# Patient Record
Sex: Female | Born: 1995 | Race: White | Hispanic: No | Marital: Married | Smoking: Never smoker
Health system: Southern US, Community
[De-identification: ages and names within clinical notes are randomized; demographics above are authoritative.]

---

## 2020-04-04 ENCOUNTER — Encounter (HOSPITAL_BASED_OUTPATIENT_CLINIC_OR_DEPARTMENT_OTHER): Payer: Self-pay | Admitting: *Deleted

## 2020-04-04 ENCOUNTER — Emergency Department (HOSPITAL_BASED_OUTPATIENT_CLINIC_OR_DEPARTMENT_OTHER): Payer: Managed Care, Other (non HMO)

## 2020-04-04 ENCOUNTER — Emergency Department (HOSPITAL_BASED_OUTPATIENT_CLINIC_OR_DEPARTMENT_OTHER)
Admission: EM | Admit: 2020-04-04 | Discharge: 2020-04-04 | Disposition: A | Discharge status: Home / Self Care | Payer: Managed Care, Other (non HMO) | Attending: Emergency Medicine | Admitting: Emergency Medicine

## 2020-04-04 ENCOUNTER — Other Ambulatory Visit: Payer: Self-pay

## 2020-04-04 DIAGNOSIS — R1012 Left upper quadrant pain: Secondary | ICD-10-CM | POA: Insufficient documentation

## 2020-04-04 DIAGNOSIS — R1011 Right upper quadrant pain: Secondary | ICD-10-CM

## 2020-04-04 LAB — URINALYSIS, ROUTINE W REFLEX MICROSCOPIC
Bilirubin Urine: NEGATIVE
Glucose, UA: NEGATIVE mg/dL
Ketones, ur: 15 mg/dL — AB
Nitrite: NEGATIVE
Protein, ur: NEGATIVE mg/dL
Specific Gravity, Urine: >1.03 — ABNORMAL HIGH (ref 1.005–1.030)
pH: 5.5 (ref 5.0–8.0)

## 2020-04-04 LAB — CBC WITH DIFFERENTIAL/PLATELET
Abs Immature Granulocytes: 0.02 10*3/uL (ref 0.00–0.07)
Basophils Absolute: 0 10*3/uL (ref 0.0–0.1)
Basophils Relative: 0 %
Eosinophils Absolute: 0 10*3/uL (ref 0.0–0.5)
Eosinophils Relative: 0 %
HCT: 37.6 % (ref 36.0–46.0)
Hemoglobin: 13 g/dL (ref 12.0–15.0)
Immature Granulocytes: 0 %
Lymphocytes Relative: 12 %
Lymphs Abs: 0.8 10*3/uL (ref 0.7–4.0)
MCH: 33.6 pg (ref 26.0–34.0)
MCHC: 34.6 g/dL (ref 30.0–36.0)
MCV: 97.2 fL (ref 80.0–100.0)
Monocytes Absolute: 0.5 10*3/uL (ref 0.1–1.0)
Monocytes Relative: 8 %
Neutro Abs: 5 10*3/uL (ref 1.7–7.7)
Neutrophils Relative %: 80 %
Platelets: 175 10*3/uL (ref 150–400)
RBC: 3.87 MIL/uL (ref 3.87–5.11)
RDW: 11.6 % (ref 11.5–15.5)
WBC: 6.2 10*3/uL (ref 4.0–10.5)
nRBC: 0 % (ref 0.0–0.2)

## 2020-04-04 LAB — PREGNANCY, URINE: Preg Test, Ur: NEGATIVE

## 2020-04-04 LAB — COMPREHENSIVE METABOLIC PANEL
ALT: 14 U/L (ref 0–44)
AST: 18 U/L (ref 15–41)
Albumin: 4.4 g/dL (ref 3.5–5.0)
Alkaline Phosphatase: 32 U/L — ABNORMAL LOW (ref 38–126)
Anion gap: 8 (ref 5–15)
BUN: 11 mg/dL (ref 6–20)
CO2: 22 mmol/L (ref 22–32)
Calcium: 9.1 mg/dL (ref 8.9–10.3)
Chloride: 108 mmol/L (ref 98–111)
Creatinine, Ser: 0.67 mg/dL (ref 0.44–1.00)
GFR, Estimated: >60 mL/min (ref >60)
Glucose, Bld: 96 mg/dL (ref 70–99)
Potassium: 3.4 mmol/L — ABNORMAL LOW (ref 3.5–5.1)
Sodium: 138 mmol/L (ref 135–145)
Total Bilirubin: 0.7 mg/dL (ref 0.3–1.2)
Total Protein: 7.3 g/dL (ref 6.5–8.1)

## 2020-04-04 LAB — URINALYSIS, MICROSCOPIC (REFLEX): RBC / HPF: >50 RBC/hpf (ref 0–5)

## 2020-04-04 LAB — HEPATITIS PANEL, ACUTE
HCV Ab: NONREACTIVE
Hep A IgM: NONREACTIVE
Hep B C IgM: NONREACTIVE
Hepatitis B Surface Ag: NONREACTIVE

## 2020-04-04 LAB — LIPASE, BLOOD: Lipase: 41 U/L (ref 11–51)

## 2020-04-04 NOTE — ED Triage Notes (Signed)
Right upper quadrant pain off and on for 2 years. Pain has been worse for the past several weeks. She was seen by her MD today and told to come here for further testing.

## 2020-04-04 NOTE — Discharge Instructions (Addendum)
Follow-up with your primary care doctor.  Continue Tylenol and Motrin as needed for pain.

## 2020-04-04 NOTE — ED Provider Notes (Signed)
Patient signed out to me at 3 PM awaiting CT scan.  Having chronic right upper quadrant pain.  CT scan was normal.  No gallstones.  Normal appendix.  No kidney stones.  Given reassurance and discharged in ED in good condition.  Lab work otherwise unremarkable.  Suspect chronic musculoskeletal type pain.  Possibly some GI related pain.  Recommend follow-up with primary care doctor.  This chart was dictated using voice recognition software.  Despite best efforts to proofread,  errors can occur which can change the documentation meaning.     Virgina Norfolk, DO 04/04/20 1636

## 2020-04-05 LAB — URINE CULTURE: Culture: 70000 — AB

## 2020-04-06 ENCOUNTER — Telehealth: Payer: Self-pay | Admitting: *Deleted

## 2020-04-06 NOTE — Telephone Encounter (Signed)
Post ED Visit - Positive Culture Follow-up  Culture report reviewed by antimicrobial stewardship pharmacist: [facility] []  Nathan Batchelder, Pharm.D. []  , Pharm.D., BCPS AQ-ID []  , Pharm.D., BCPS []  Celedonio Miyamoto, Pharm.D., BCPS []  [place], Garvin Fila.D., BCPS, AAHIVP []  , Pharm.D., BCPS, AAHIVP []  Georgina Pillion, PharmD, BCPS []  , PharmD, BCPS []  [place], PharmD, BCPS []  [address], PharmD []  , PharmD, BCPS []  Estella Husk, PharmD  Pharmacy Team []  Lysle Pearl, PharmD []  , PharmD []  Phillips Climes, PharmD []  , Rph []  Agapito Games) , PharmD []  Verlan Friends, PharmD []  , PharmD []  Mervyn Gay, PharmD []  , PharmD []  Vinnie Level, PharmD []  [facility], PharmD []  , PharmD []  Len Childs, PharmD   Positive urine culture No UTI symptoms, WBCs WNL and no further patient follow-up is required at this time.  [facility] 04/06/2020, 10:24 AM

## 2020-04-14 NOTE — ED Provider Notes (Signed)
[facility] EMERGENCY DEPARTMENT Provider Note   CSN: [id number] Arrival date & time: 04/04/20  1208     History Chief Complaint  Patient presents with  . Abdominal Pain    Deborah Berry is a 24 y.o. female.  Patient is a 24 year old female with no significant past medical history.  She presents with complaints of left upper quadrant pain radiating to the left flank.  Patient states that she has been having this discomfort on and off for the past 2 years.  It has become worse over the past 2 weeks and was instructed by her doctor to come to the ER for further evaluation.  She denies any bowel or bladder complaints.  She denies any fevers or chills.  There are no aggravating or alleviating factors and the pain seems to occur at random.  The history is provided by the patient.       History reviewed. No pertinent past medical history.  There are no problems to display for this patient.   History reviewed. No pertinent surgical history.   OB History   No obstetric history on file.     No family history on file.  Social History   Tobacco Use  . Smoking status: Never Smoker  . Smokeless tobacco: Never Used  Substance Use Topics  . Alcohol use: Never  . Drug use: Never    Home Medications Prior to Admission medications   Not on File    Allergies    Penicillins  Review of Systems   Review of Systems  All other systems reviewed and are negative.   Physical Exam Updated Vital Signs BP 123/70 (BP Location: Left Arm)   Pulse 85   Temp 98.3 F (36.8 C) (Oral)   Resp 16   Ht 5' 6.5" (1.689 m)   Wt 51.3 kg   LMP 04/04/2020   SpO2 100%   BMI 17.97 kg/m   Physical Exam Vitals and nursing note reviewed.  Constitutional:      General: She is not in acute distress.    Appearance: She is well-developed and well-nourished. She is not diaphoretic.  HENT:     Head: Normocephalic and atraumatic.  Cardiovascular:     Rate and Rhythm: Normal rate  and regular rhythm.     Heart sounds: No murmur heard. No friction rub. No gallop.   Pulmonary:     Effort: Pulmonary effort is normal. No respiratory distress.     Breath sounds: Normal breath sounds. No wheezing.  Abdominal:     General: Bowel sounds are normal. There is no distension.     Palpations: Abdomen is soft.     Tenderness: There is abdominal tenderness in the left upper quadrant. There is left CVA tenderness. There is no right CVA tenderness, guarding or rebound.  Musculoskeletal:        General: Normal range of motion.     Cervical back: Normal range of motion and neck supple.  Skin:    General: Skin is warm and dry.  Neurological:     Mental Status: She is alert and oriented to person, place, and time.     ED Results / Procedures / Treatments   Labs (all labs ordered are listed, but only abnormal results are displayed) Labs Reviewed  URINE CULTURE - Abnormal; Notable for the following components:      Result Value   Culture   (*)    Value: 70,000 COLONIES/mL GROUP B STREP(S.AGALACTIAE)ISOLATED TESTING AGAINST S. AGALACTIAE NOT ROUTINELY  PERFORMED DUE TO PREDICTABILITY OF AMP/PEN/VAN SUSCEPTIBILITY. Performed at [facility], [address] [place], [place] [address]    All other components within normal limits  COMPREHENSIVE METABOLIC PANEL - Abnormal; Notable for the following components:   Potassium 3.4 (*)    Alkaline Phosphatase 32 (*)    All other components within normal limits  URINALYSIS, ROUTINE W REFLEX MICROSCOPIC - Abnormal; Notable for the following components:   APPearance CLOUDY (*)    Specific Gravity, Urine >1.030 (*)    Hgb urine dipstick LARGE (*)    Ketones, ur 15 (*)    Leukocytes,Ua TRACE (*)    All other components within normal limits  URINALYSIS, MICROSCOPIC (REFLEX) - Abnormal; Notable for the following components:   Bacteria, UA MANY (*)    All other components within normal limits  LIPASE, BLOOD  CBC WITH  DIFFERENTIAL/PLATELET  PREGNANCY, URINE  HEPATITIS PANEL, ACUTE    EKG None  Radiology No results found.  Procedures Procedures (including critical care time)  Medications Ordered in ED Medications - No data to display  ED Course  I have reviewed the triage vital signs and the nursing notes.  Pertinent labs & imaging results that were available during my care of the patient were reviewed by me and considered in my medical decision making (see chart for details).    MDM Rules/Calculators/A&P  Patient's laboratory studies thus far unremarkable.  She has no white count and urine shows only blood, but no convincing evidence of infection.  I had extensive discussion with the patient regarding imaging studies.  He has ultimately agreed to have a CT scan.  This study will be obtained.  Care will be signed out to the oncoming provider at shift change who will obtain the results of the study and determine the final disposition.  Final Clinical Impression(s) / ED Diagnoses Final diagnoses:  RUQ pain    Rx / DC Orders ED Discharge Orders    None       Geoffery Lyons, MD 04/14/20 2327

## 2020-10-14 ENCOUNTER — Other Ambulatory Visit: Payer: Self-pay

## 2020-10-14 ENCOUNTER — Emergency Department (HOSPITAL_BASED_OUTPATIENT_CLINIC_OR_DEPARTMENT_OTHER): Payer: Managed Care, Other (non HMO)

## 2020-10-14 ENCOUNTER — Encounter (HOSPITAL_BASED_OUTPATIENT_CLINIC_OR_DEPARTMENT_OTHER): Payer: Self-pay | Admitting: Emergency Medicine

## 2020-10-14 ENCOUNTER — Emergency Department (HOSPITAL_BASED_OUTPATIENT_CLINIC_OR_DEPARTMENT_OTHER)
Admission: EM | Admit: 2020-10-14 | Discharge: 2020-10-14 | Disposition: A | Discharge status: Home / Self Care | Payer: Managed Care, Other (non HMO) | Attending: Emergency Medicine | Admitting: Emergency Medicine

## 2020-10-14 DIAGNOSIS — R102 Pelvic and perineal pain: Secondary | ICD-10-CM | POA: Diagnosis present

## 2020-10-14 DIAGNOSIS — R1031 Right lower quadrant pain: Secondary | ICD-10-CM | POA: Diagnosis not present

## 2020-10-14 LAB — COMPREHENSIVE METABOLIC PANEL
ALT: 14 U/L (ref 0–44)
AST: 17 U/L (ref 15–41)
Albumin: 4.7 g/dL (ref 3.5–5.0)
Alkaline Phosphatase: 36 U/L — ABNORMAL LOW (ref 38–126)
Anion gap: 8 (ref 5–15)
BUN: 15 mg/dL (ref 6–20)
CO2: 26 mmol/L (ref 22–32)
Calcium: 9.4 mg/dL (ref 8.9–10.3)
Chloride: 106 mmol/L (ref 98–111)
Creatinine, Ser: 0.8 mg/dL (ref 0.44–1.00)
GFR, Estimated: >60 mL/min (ref >60)
Glucose, Bld: 101 mg/dL — ABNORMAL HIGH (ref 70–99)
Potassium: 3.6 mmol/L (ref 3.5–5.1)
Sodium: 140 mmol/L (ref 135–145)
Total Bilirubin: 0.8 mg/dL (ref 0.3–1.2)
Total Protein: 7.7 g/dL (ref 6.5–8.1)

## 2020-10-14 LAB — URINALYSIS, ROUTINE W REFLEX MICROSCOPIC
Bilirubin Urine: NEGATIVE
Glucose, UA: NEGATIVE mg/dL
Ketones, ur: 15 mg/dL — AB
Leukocytes,Ua: NEGATIVE
Nitrite: NEGATIVE
Protein, ur: NEGATIVE mg/dL
Specific Gravity, Urine: >1.03 — ABNORMAL HIGH (ref 1.005–1.030)
pH: 6 (ref 5.0–8.0)

## 2020-10-14 LAB — CBC
HCT: 38.6 % (ref 36.0–46.0)
Hemoglobin: 12.9 g/dL (ref 12.0–15.0)
MCH: 33.1 pg (ref 26.0–34.0)
MCHC: 33.4 g/dL (ref 30.0–36.0)
MCV: 99 fL (ref 80.0–100.0)
Platelets: 234 10*3/uL (ref 150–400)
RBC: 3.9 MIL/uL (ref 3.87–5.11)
RDW: 11.9 % (ref 11.5–15.5)
WBC: 4.9 10*3/uL (ref 4.0–10.5)
nRBC: 0 % (ref 0.0–0.2)

## 2020-10-14 LAB — LIPASE, BLOOD: Lipase: 52 U/L — ABNORMAL HIGH (ref 11–51)

## 2020-10-14 LAB — URINALYSIS, MICROSCOPIC (REFLEX)

## 2020-10-14 LAB — PREGNANCY, URINE: Preg Test, Ur: NEGATIVE

## 2020-10-14 MED ORDER — IBUPROFEN 800 MG PO TABS: 800.0000 mg | ORAL_TABLET | Freq: Three times a day (TID) | ORAL | 0 refills | Status: DC

## 2020-10-14 NOTE — ED Notes (Signed)
ED Provider at bedside. 

## 2020-10-14 NOTE — Discharge Instructions (Addendum)
1.  You may take ibuprofen 800 mg every 8 hours for pain. 2.  If the pain is worsening, concentrating more in the right lower abdomen or if you develop fever or worsening symptoms, return to emergency department for recheck. 3.  If the pain is not getting any worse or improving, schedule a recheck with your gynecologist as soon as possible.

## 2020-10-14 NOTE — ED Provider Notes (Signed)
[facility] EMERGENCY DEPARTMENT Provider Note   CSN: [id number] Arrival date & time: 10/14/20  1748     History Chief Complaint  Patient presents with   Abdominal Pain    Deborah Berry is a 25 y.o. female with no relevant past medical history presents the ED with a 1 day history of right lower quadrant abdominal pain.  On my examination, patient reports that for the past day she has been experiencing right sided pelvic pain that radiates to her right lower back.  She states that the symptoms are waxing and waning.  She has had nausea, but no emesis.  She denies any history of prior.  She does state that she has a history of dysmenorrhea that is often right-sided and she is currently on her menses.  However, she states this feels different which prompted her to come to the ED for evaluation.  She states that she does not want any CT imaging even though she is concerned about appendicitis.  She states that she first wanted to at least have a physical exam and laboratory work-up performed.  She denies any fevers or chills, emesis, history of ovarian cysts, dyspareunia, vaginal discharge, concern for STDs, or any other symptoms.  She is in a committed relationship with her husband and has a 27-year-old at home.  She is not constipated, but states that her stools have been mildly firmer than usual.  Most recent bowel movement was this morning.  Denies any urinary symptoms.  She does have a family history of kidney stones.  HPI     History reviewed. No pertinent past medical history.  There are no problems to display for this patient.   History reviewed. No pertinent surgical history.   OB History   No obstetric history on file.     No family history on file.  Social History   Tobacco Use   Smoking status: Never   Smokeless tobacco: Never  Substance Use Topics   Alcohol use: Never   Drug use: Never    Home Medications Prior to Admission medications   Not  on File    Allergies    Penicillins  Review of Systems   Review of Systems  All other systems reviewed and are negative.  Physical Exam Updated Vital Signs BP 107/63 (BP Location: Right Arm)   Pulse 65   Temp 98.6 F (37 C) (Oral)   Resp 16   Ht 5\' 6"  (1.676 m)   Wt 51.3 kg   LMP 10/09/2020   SpO2 100%   BMI 18.24 kg/m   Physical Exam Vitals and nursing note reviewed. Exam conducted with a chaperone present.  Constitutional:      General: She is not in acute distress.    Appearance: Normal appearance. She is not toxic-appearing.  HENT:     Head: Normocephalic and atraumatic.  Eyes:     General: No scleral icterus.    Conjunctiva/sclera: Conjunctivae normal.  Cardiovascular:     Rate and Rhythm: Normal rate.     Pulses: Normal pulses.  Pulmonary:     Effort: Pulmonary effort is normal. No respiratory distress.  Abdominal:     General: Abdomen is flat. There is no distension.     Palpations: Abdomen is soft. There is no mass.     Tenderness: There is no abdominal tenderness. There is no guarding.     Hernia: No hernia is present.     Comments: Soft, nondistended.  There is no abdominal tenderness  to palpation.  No overlying skin changes.  Musculoskeletal:     Cervical back: Normal range of motion.  Skin:    General: Skin is dry.  Neurological:     Mental Status: She is alert and oriented to person, place, and time.     GCS: GCS eye subscore is 4. GCS verbal subscore is 5. GCS motor subscore is 6.  Psychiatric:        Mood and Affect: Mood normal.        Behavior: Behavior normal.        Thought Content: Thought content normal.    ED Results / Procedures / Treatments   Labs (all labs ordered are listed, but only abnormal results are displayed) Labs Reviewed  LIPASE, BLOOD - Abnormal; Notable for the following components:      Result Value   Lipase 52 (*)    All other components within normal limits  COMPREHENSIVE METABOLIC PANEL - Abnormal; Notable for  the following components:   Glucose, Bld 101 (*)    Alkaline Phosphatase 36 (*)    All other components within normal limits  URINALYSIS, ROUTINE W REFLEX MICROSCOPIC - Abnormal; Notable for the following components:   Specific Gravity, Urine >1.030 (*)    Hgb urine dipstick TRACE (*)    Ketones, ur 15 (*)    All other components within normal limits  URINALYSIS, MICROSCOPIC (REFLEX) - Abnormal; Notable for the following components:   Bacteria, UA MANY (*)    All other components within normal limits  CBC  PREGNANCY, URINE    EKG None  Radiology No results found.  Procedures Procedures   Medications Ordered in ED Medications - No data to display  ED Course  I have reviewed the triage vital signs and the nursing notes.  Pertinent labs & imaging results that were available during my care of the patient were reviewed by me and considered in my medical decision making (see chart for details).    MDM Rules/Calculators/A&P                          Deborah Berry was evaluated in Emergency Department on 10/14/2020 for the symptoms described in the history of present illness. She was evaluated in the context of the global COVID-19 pandemic, which necessitated consideration that the patient might be at risk for infection with the SARS-CoV-2 virus that causes COVID-19. Institutional protocols and algorithms that pertain to the evaluation of patients at risk for COVID-19 are in a state of rapid change based on information released by regulatory bodies including the CDC and federal and state organizations. These policies and algorithms were followed during the patient's care in the ED.  I personally reviewed patient's medical chart and all notes from triage and staff during today's encounter. I have also ordered and reviewed all labs and imaging that I felt to be medically necessary in the evaluation of this patient's complaints and with consideration of their physical exam. If needed,  translation services were available and utilized.   Patient's pain symptoms seem to be pelvic in etiology.  We will proceed with ultrasound before they leave here for the evening.  Suspect ovarian cyst, but would like to rule out ovarian torsion.  If negative, likely her known dysmenorrhea.  Do not feel as though CT is warranted given lack of McBurney's point tenderness or other significant abdominal tenderness on my exam.  Again, suspect this is pelvic.  She has been afebrile  and her laboratory work-up is entirely reassuring.  No leukocytosis concerning for systemic illness.  Her vital signs are stable and within normal limits.  Additionally, after discussion with patient, she would prefer continued observation rather than CT imaging.  At shift change care was transferred to Dr. Clarice Pole who will follow pending studies, re-evaluate, and determine disposition.    If ultrasound is negative, patient is reasonable for discharge home.  She does not want CT imaging and her abdominal exam is entirely benign.  Laboratory work-up is reassuring.  She is afebrile and vital signs stable.  If there is no large ovarian cyst or ovarian torsion, suspect that her pain symptoms are related to her known history of right-sided dysmenorrhea.  Final Clinical Impression(s) / ED Diagnoses Final diagnoses:  Pelvic pain    Rx / DC Orders ED Discharge Orders     None        Elvera Maria 10/14/20 2121    Arby Barrette, MD 10/14/20 2249

## 2020-10-14 NOTE — ED Triage Notes (Signed)
Reports RLQ abdominal pain since yesterday.  Does endorse mild constipation and nausea.  Denies any vomiting or diarrhea.

## 2022-04-04 IMAGING — CT CT RENAL STONE PROTOCOL
2 of 4 series · 16 of 46 positions shown, 18 images · non-contrast
Comparison: Ultrasound 03/04/2020

CLINICAL DATA: Flank pain, stone disease suspected, intermittent
right upper quadrant pain for 2 years. Worsening over the past
several weeks.

EXAM:
CT ABDOMEN AND PELVIS WITHOUT CONTRAST
TECHNIQUE: Multidetector CT imaging of the abdomen and pelvis was performed
following the standard protocol without IV contrast.

[Series 2: axial st · axial · 0.72mm/px · z∈[-471,-81]mm · 13 of 86 slices shown, 15 images]
[im 4/86  soft-tissue]
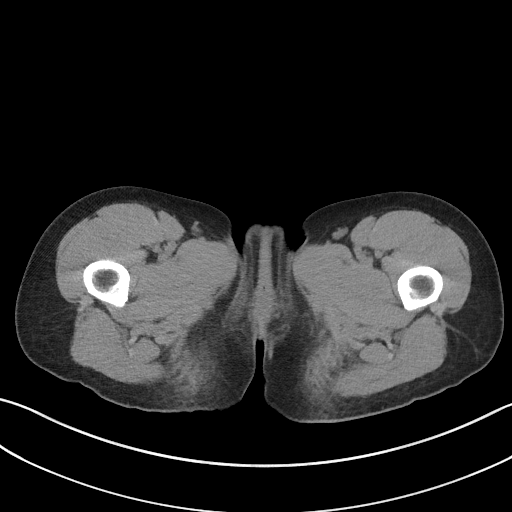
[im 4/86  bone]
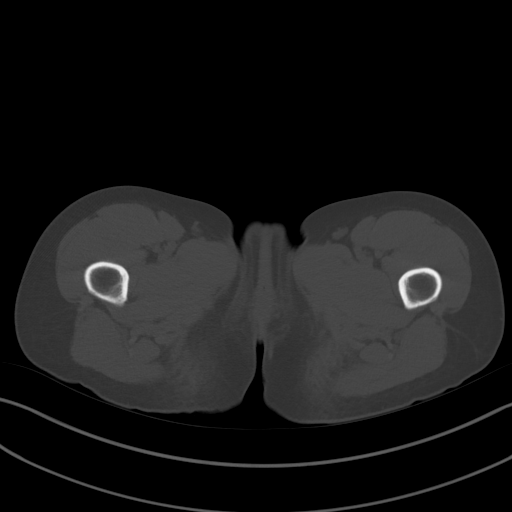
[im 11/86  soft-tissue]
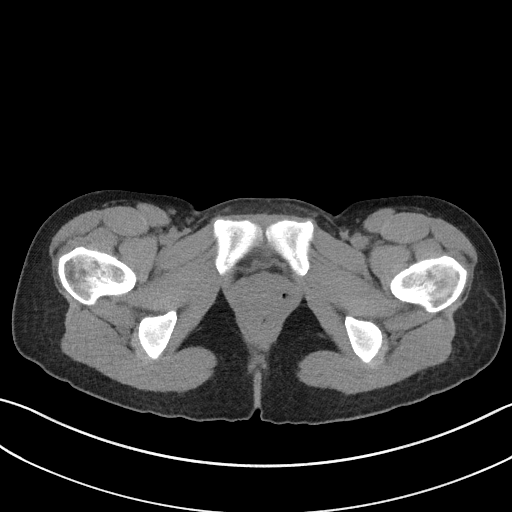
[im 18/86  soft-tissue]
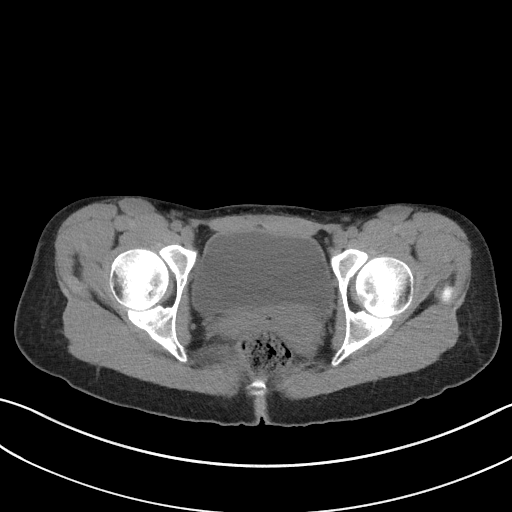
[im 24/86  soft-tissue]
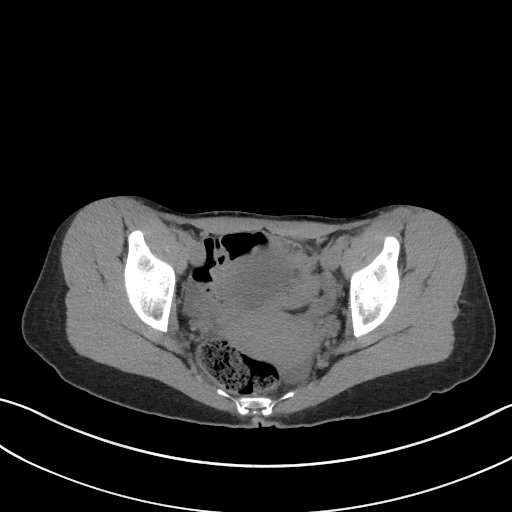
[im 31/86  soft-tissue]
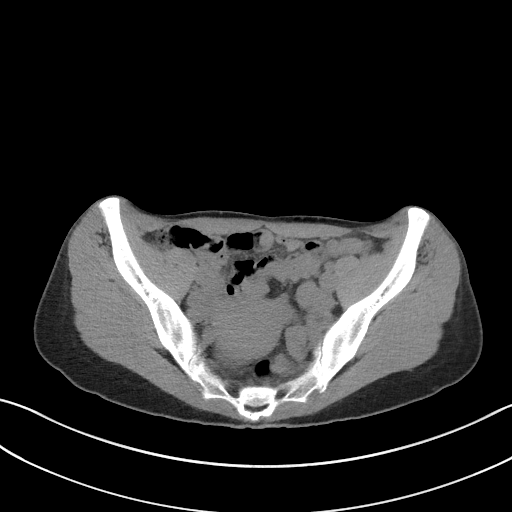
[im 38/86  soft-tissue]
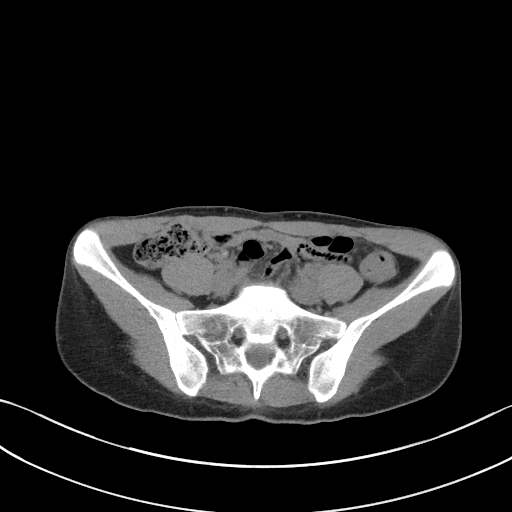
[im 45/86  soft-tissue]
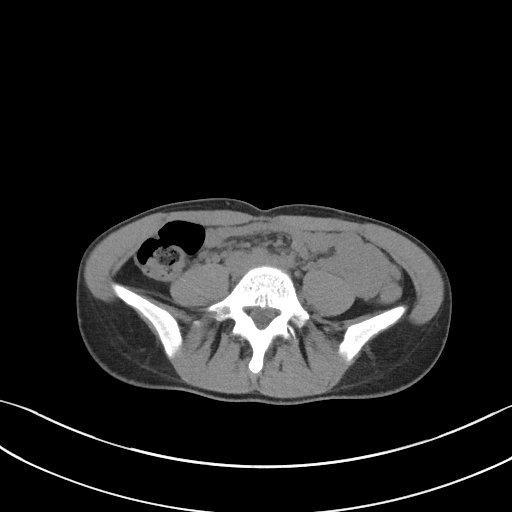
[im 48/86  soft-tissue]
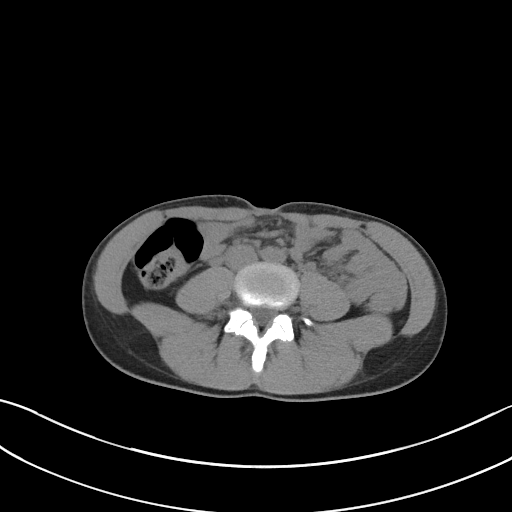
[im 55/86  soft-tissue]
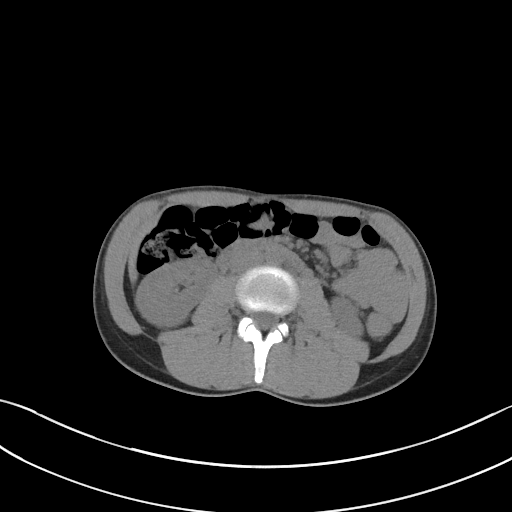
[im 55/86  bone]
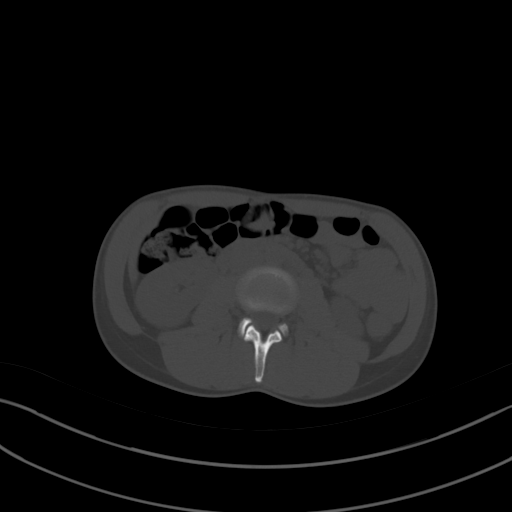
[im 62/86  soft-tissue]
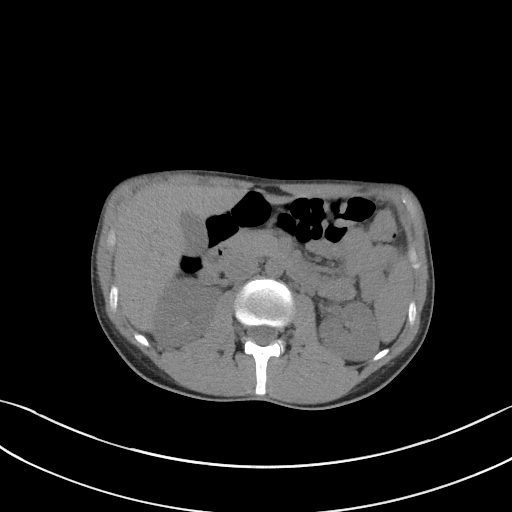
[im 69/86  soft-tissue]
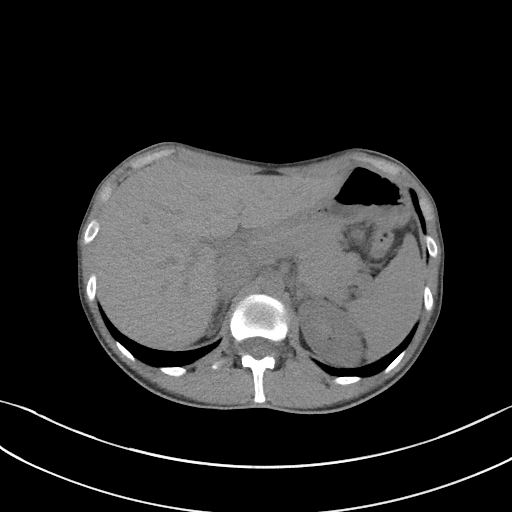
[im 75/86  soft-tissue]
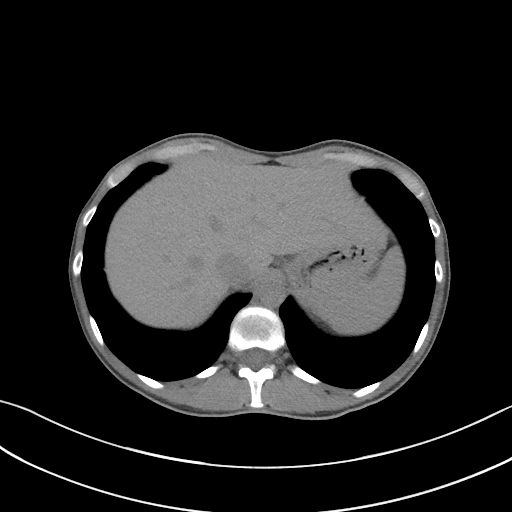
[im 82/86  soft-tissue]
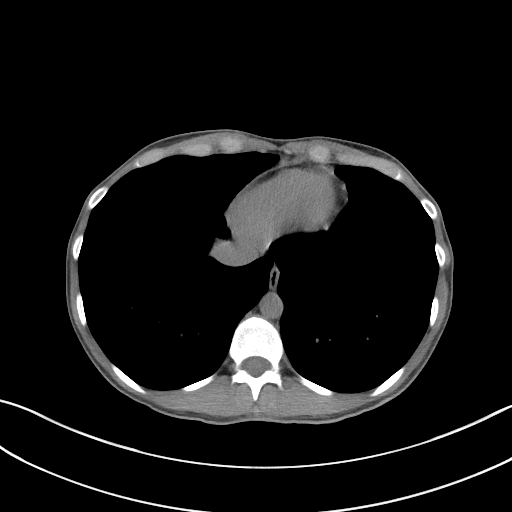

[Series 5: coronal st · coronal · 0.66mm/px · 3 of 70 slices shown]
[im 24/70  soft-tissue]
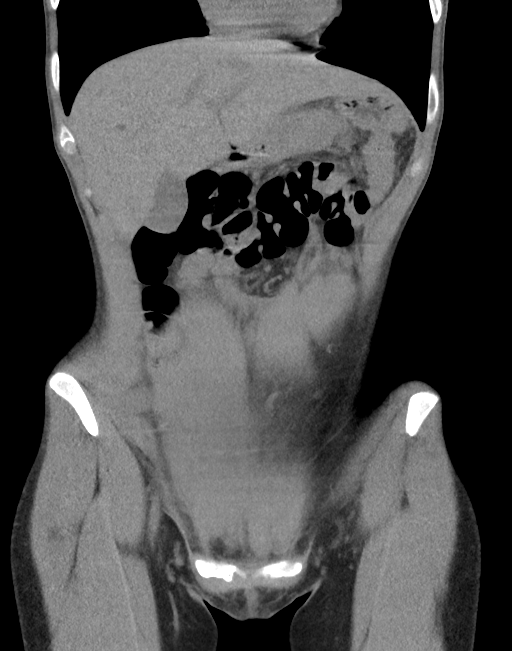
[im 31/70  soft-tissue]
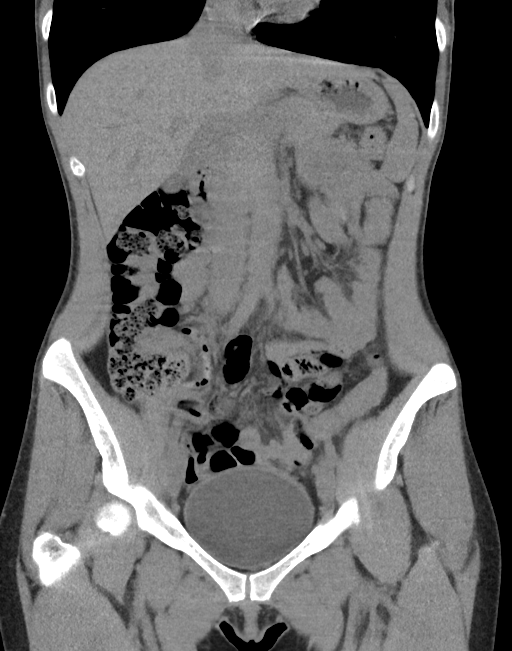
[im 39/70  soft-tissue]
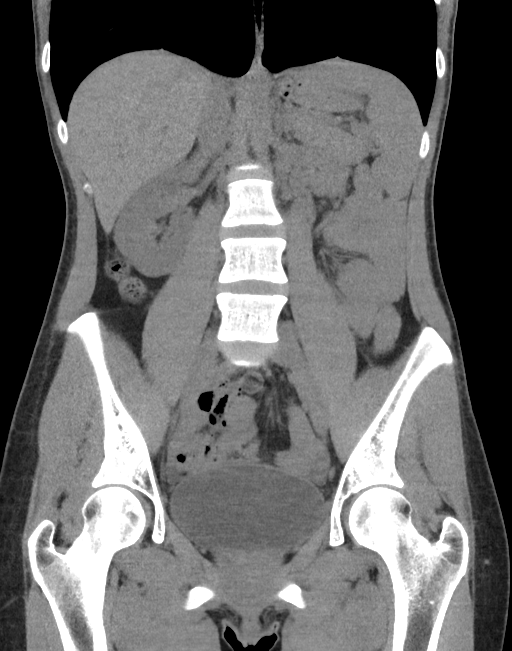

[16 of 46 positions shown; findings below may reference images not displayed]

FINDINGS: Lower chest: Lung bases are clear. Normal heart size. No pericardial
effusion.

Hepatobiliary: Few subcentimeter hypoattenuating foci in the liver
too small to fully characterize on CT imaging but statistically
likely benign ([DATE], 21, 19). Normal liver attenuation. Smooth liver
surface contour. Normal gallbladder and biliary tree.

Pancreas: No pancreatic ductal dilatation or surrounding
inflammatory changes.

Spleen: Normal in size. No concerning splenic lesions.

Adrenals/Urinary Tract: Normal adrenal glands. Kidneys are symmetric
in size and normally located. No visible or contour deforming renal
lesions. No visible urolithiasis or hydronephrosis. Urinary bladder
is unremarkable for degree of distension.

Stomach/Bowel: Challenging assessment of the bowel and mesentery
given a paucity of intraperitoneal fat. Distal esophagus, stomach
and duodenal sweep are unremarkable. No small bowel wall thickening
or dilatation. No evidence of obstruction. A normal appendix is
visualized.

Vascular/Lymphatic: No significant vascular findings are present. No
enlarged abdominal or pelvic lymph nodes.

Reproductive: Slightly retroverted uterus. No concerning adnexal
lesions.

Other: Small volume free fluid in the deep pelvis is nonspecific in
a reproductive age female, often times physiologic. No bowel
containing hernias.

Musculoskeletal: No acute osseous abnormality or suspicious osseous
lesion.
IMPRESSION: 1. No visible urolithiasis or hydronephrosis.
2. Small volume free fluid in the deep pelvis is nonspecific in a
reproductive age female, often physiologic.

## 2022-04-04 IMAGING — US US ABDOMEN LIMITED
1 series · 14 of 25 positions shown · non-contrast
Comparison: None.

CLINICAL DATA: Right upper quadrant pain

EXAM:
ULTRASOUND ABDOMEN LIMITED RIGHT UPPER QUADRANT

[Series 1: us abdomen limited · 14 of 32 slices shown]
[im 1/32]
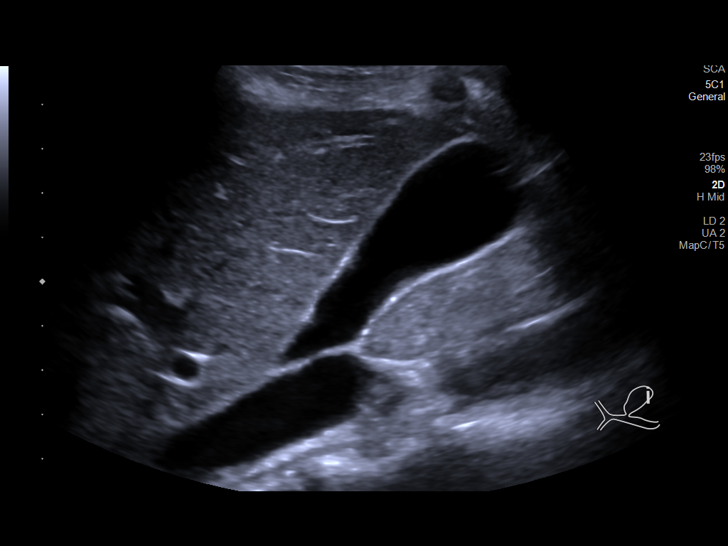
[im 3/32]
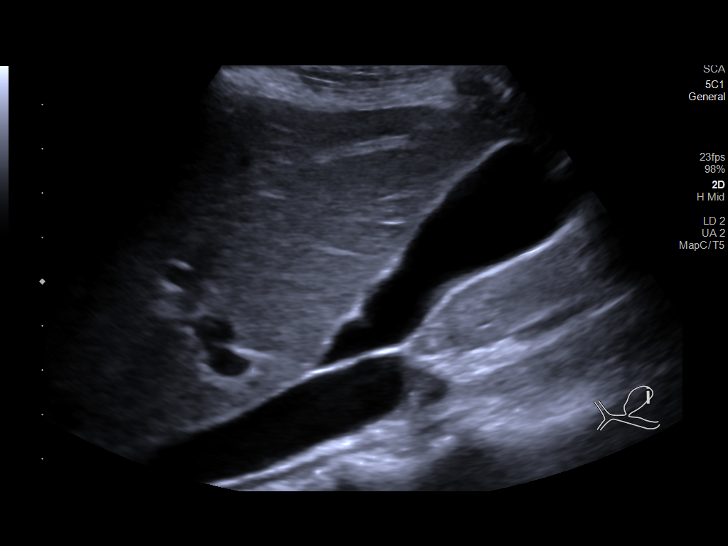
[im 6/32]
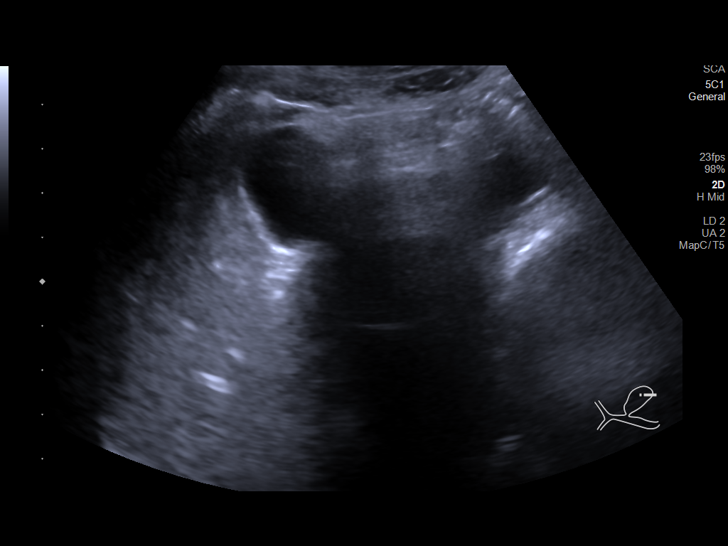
[im 8/32]
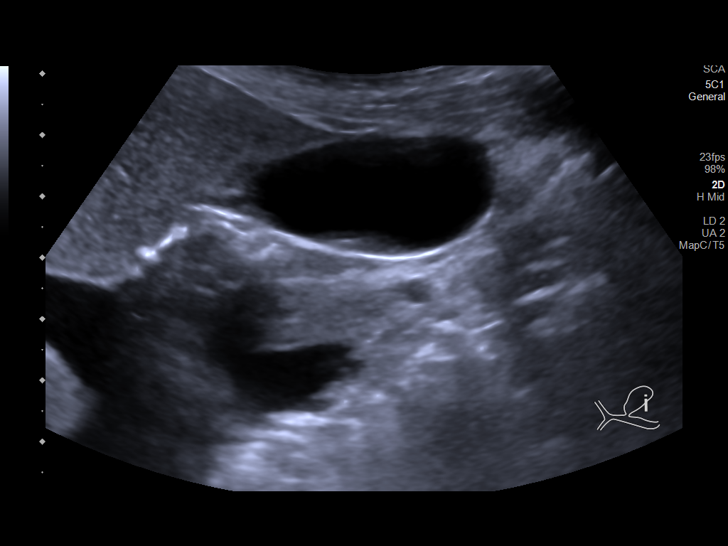
[im 11/32]
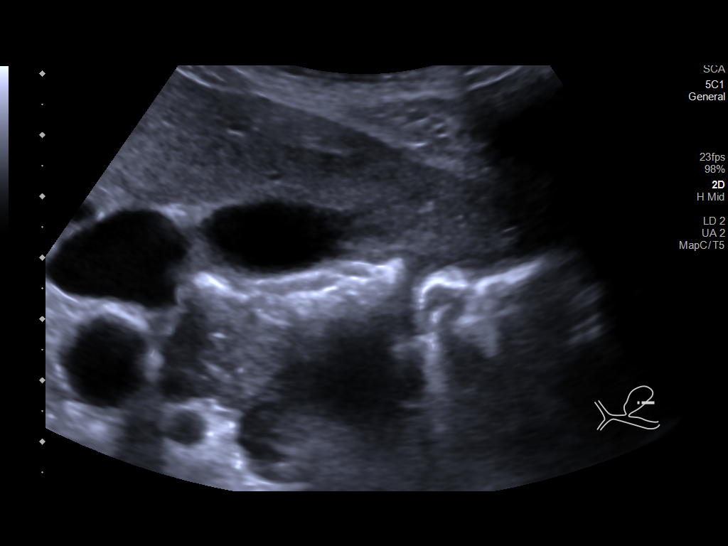
[im 12/32]
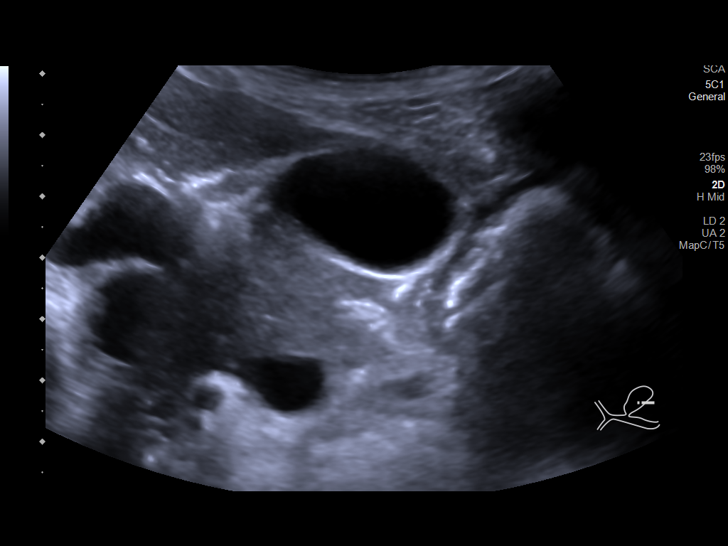
[im 15/32]
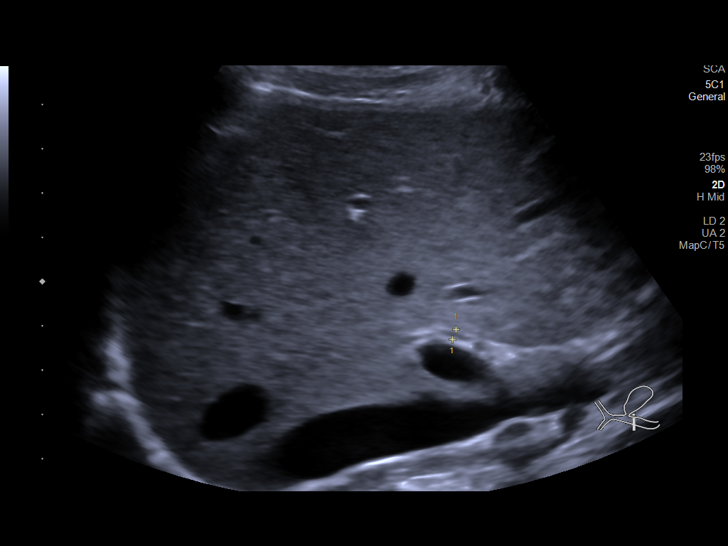
[im 17/32]
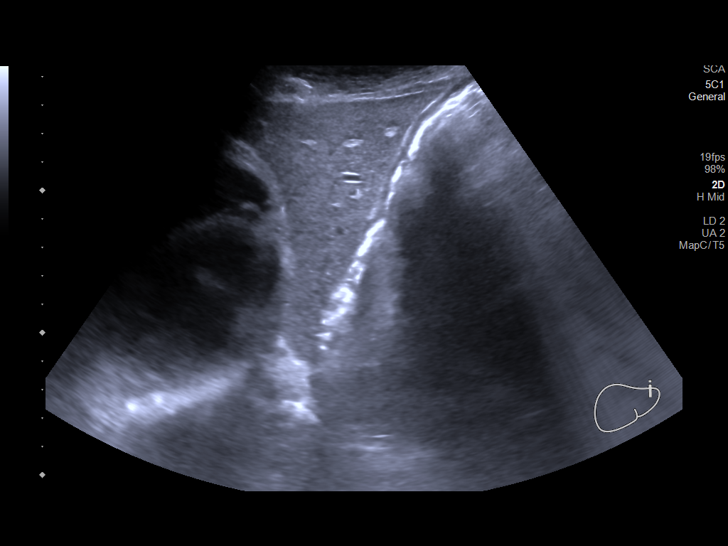
[im 20/32]
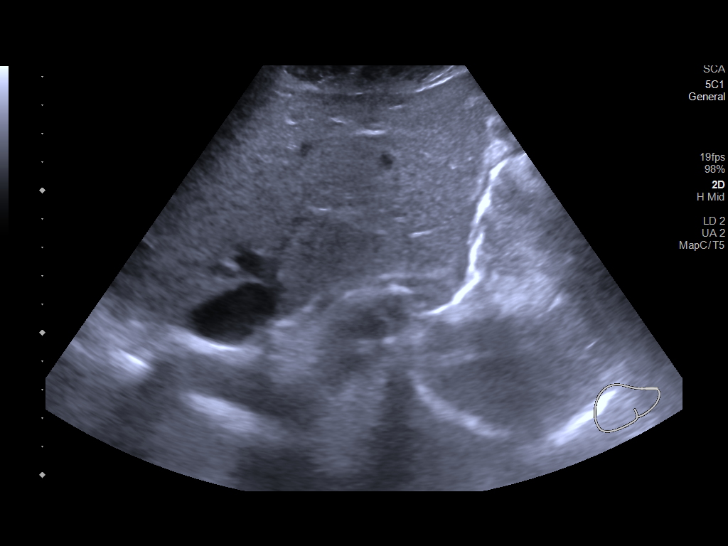
[im 21/32]
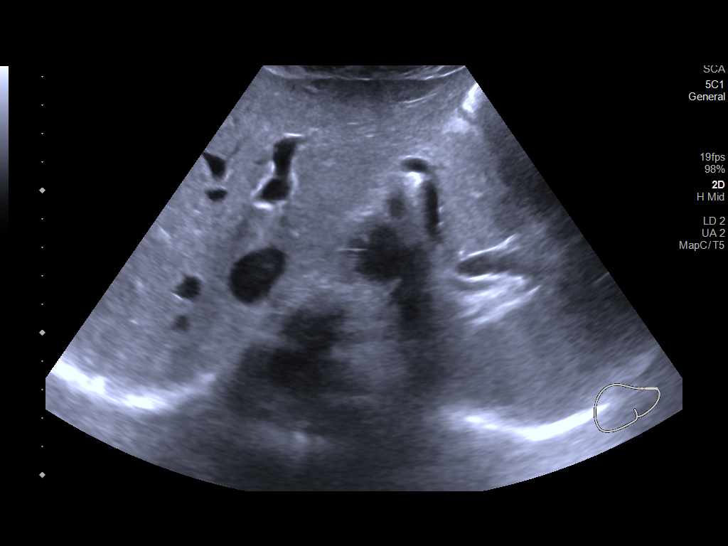
[im 24/32]
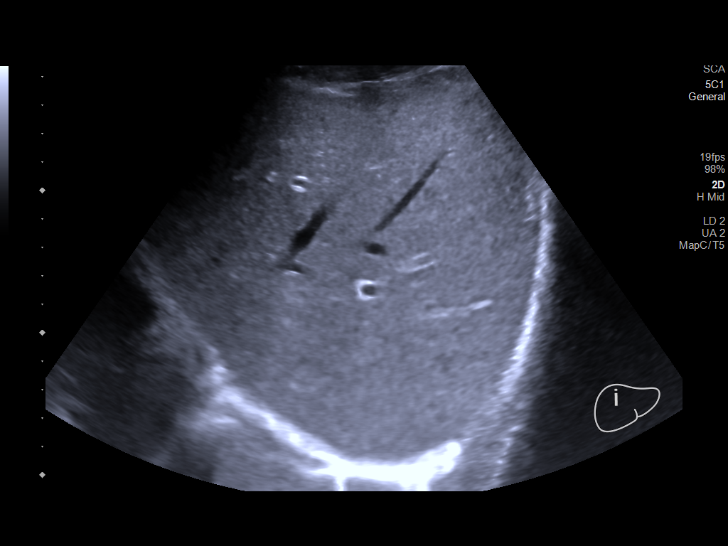
[im 26/32]
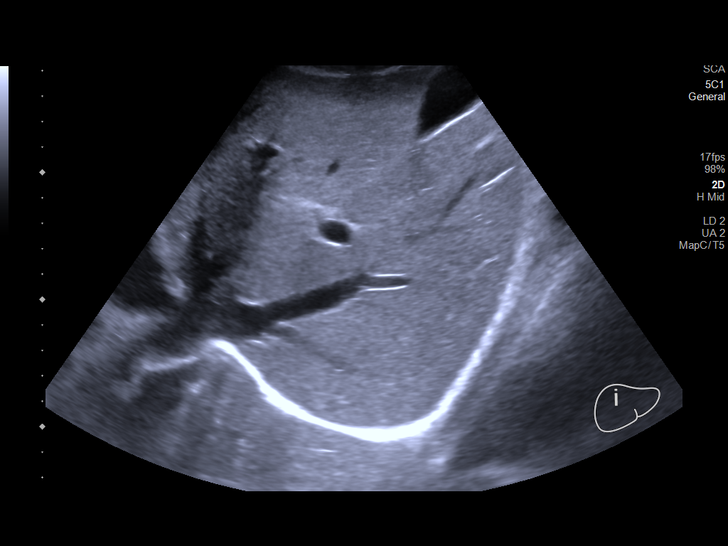
[im 29/32]
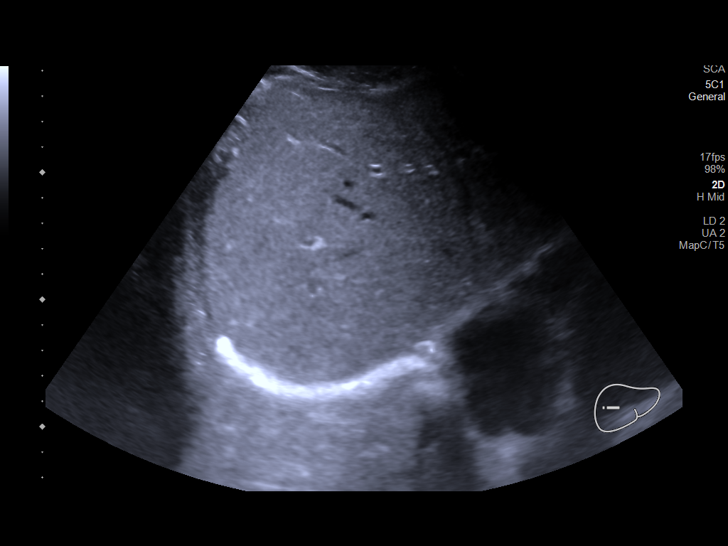
[im 32/32]
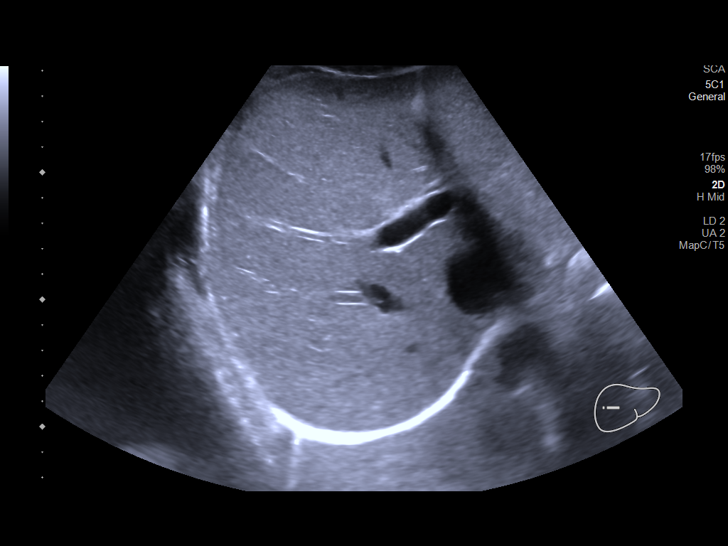

[14 of 25 positions shown; findings below may reference images not displayed]

FINDINGS: Gallbladder:

No gallstones or wall thickening visualized. No sonographic Murphy
sign noted by sonographer.

Common bile duct:

Diameter: 2 mm, normal

Liver:

No focal lesion identified. Within normal limits in parenchymal
echogenicity. Portal vein is patent on color Doppler imaging with
normal direction of blood flow towards the liver.

Other: None.
IMPRESSION: Normal right upper quadrant ultrasound.

## 2022-10-14 IMAGING — US US PELVIS COMPLETE TRANSABD/TRANSVAG W DUPLEX
1 series · 13 of 25 positions shown · non-contrast
Comparison: Prior CT from 04/04/2020.

CLINICAL DATA: Initial evaluation for acute right lower
quadrant/right flank pain with nausea.

EXAM:
TRANSABDOMINAL AND TRANSVAGINAL ULTRASOUND OF PELVIS
DOPPLER ULTRASOUND OF OVARIES
TECHNIQUE: Both transabdominal and transvaginal ultrasound examinations of the
pelvis were performed. Transabdominal technique was performed for
global imaging of the pelvis including uterus, ovaries, adnexal
regions, and pelvic cul-de-sac.
It was necessary to proceed with endovaginal exam following the
transabdominal exam to visualize the uterus, endometrium, and
ovaries. Color and duplex Doppler ultrasound was utilized to
evaluate blood flow to the ovaries.

[Series 1: us pelvis complete transabd/transvag w duplex · 13 of 74 slices shown]
[im 1/74]
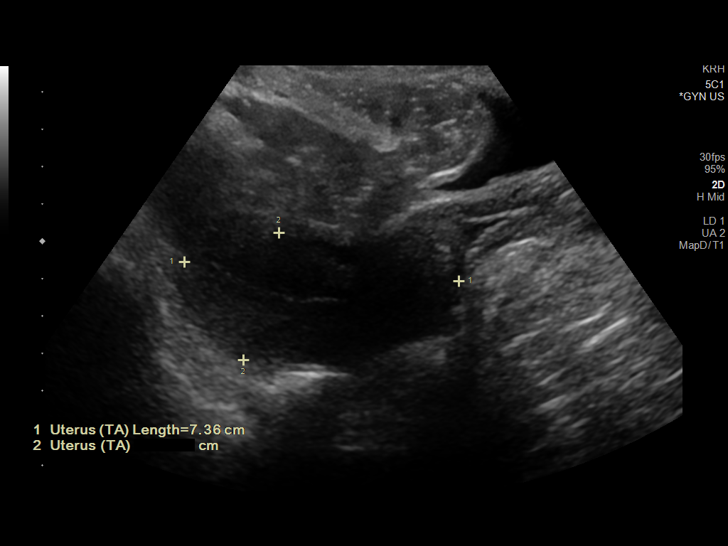
[im 7/74]
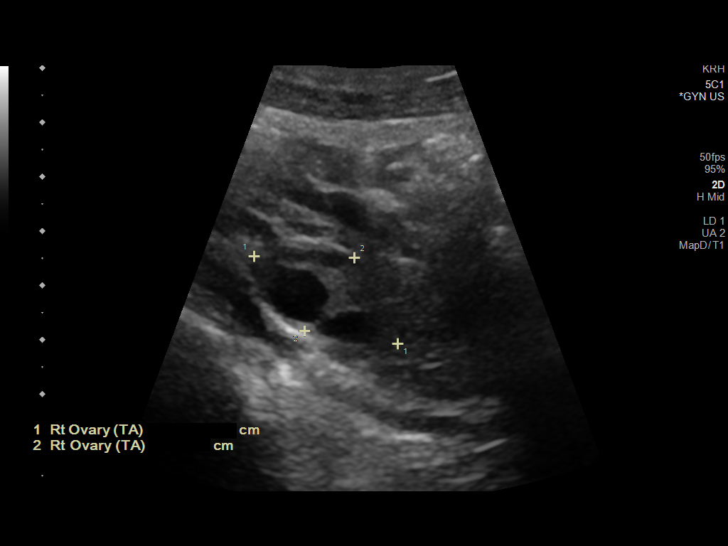
[im 13/74]
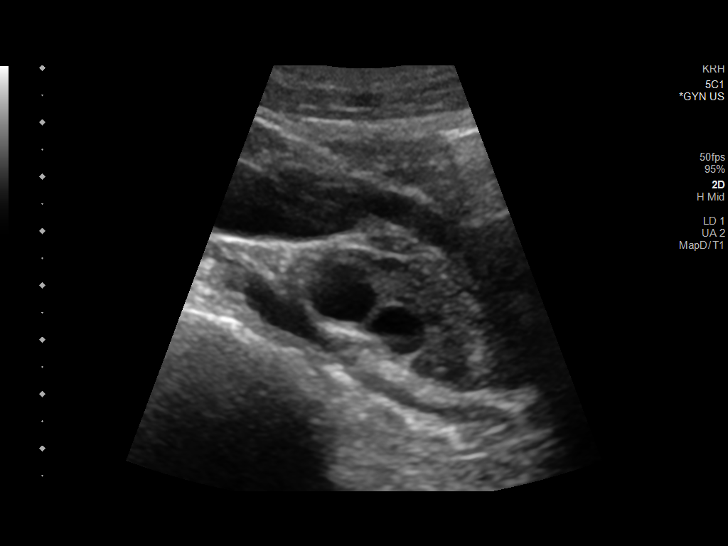
[im 19/74]
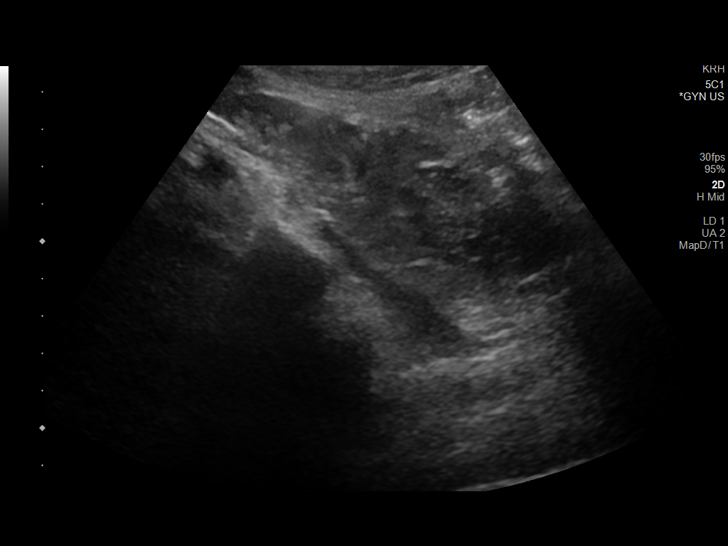
[im 25/74]
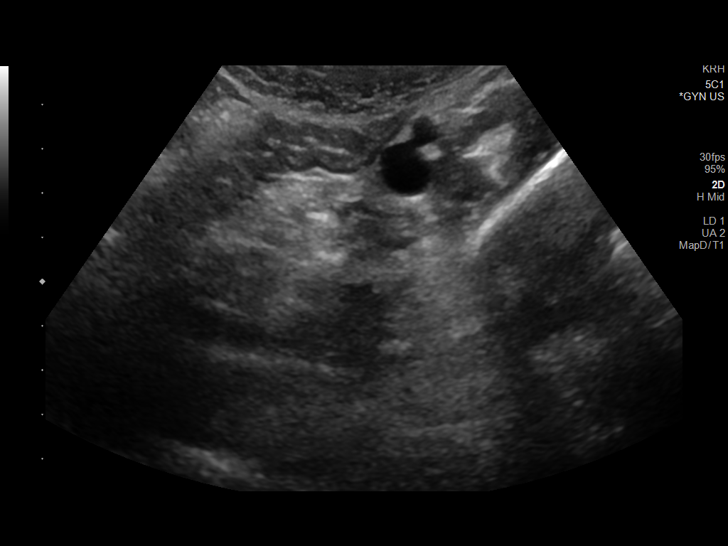
[im 31/74]
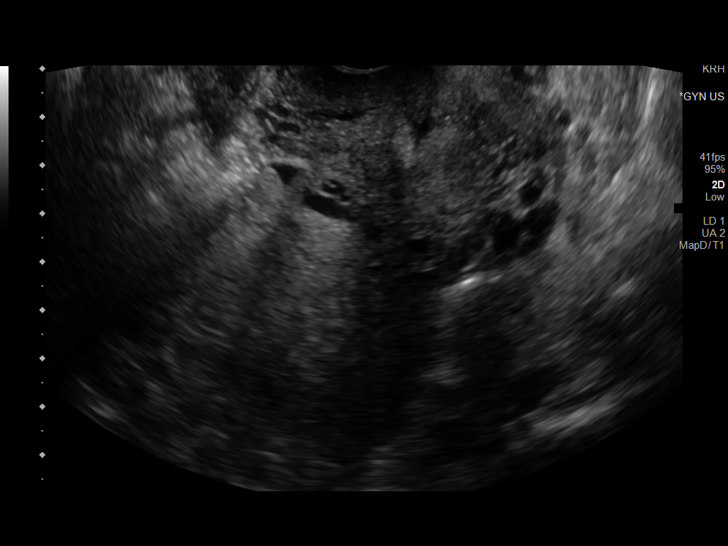
[im 37/74]
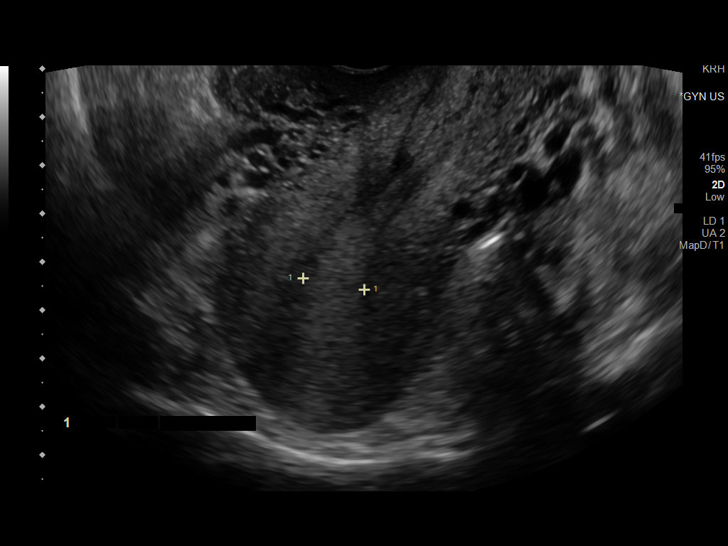
[im 43/74]
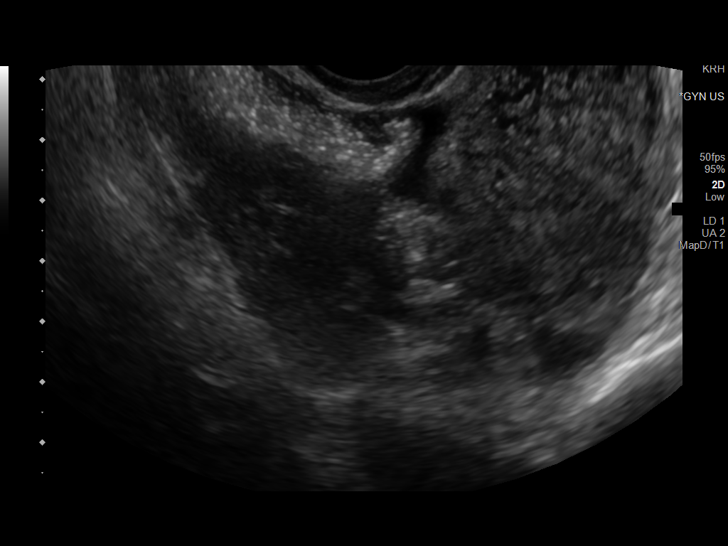
[im 49/74]
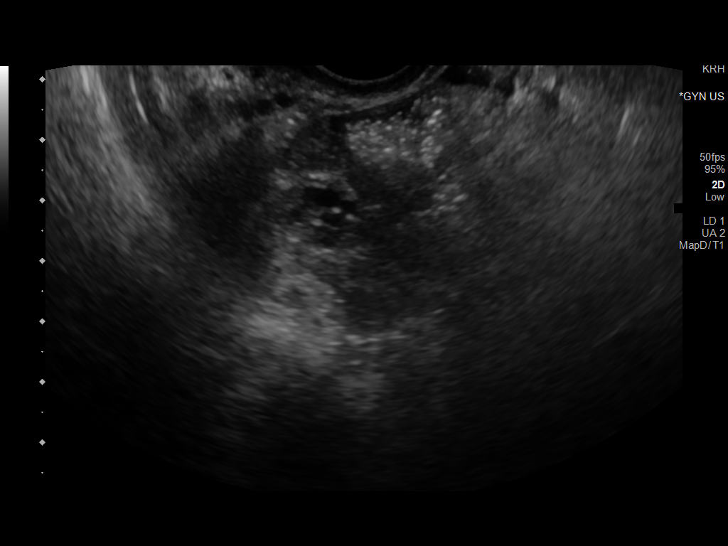
[im 55/74]
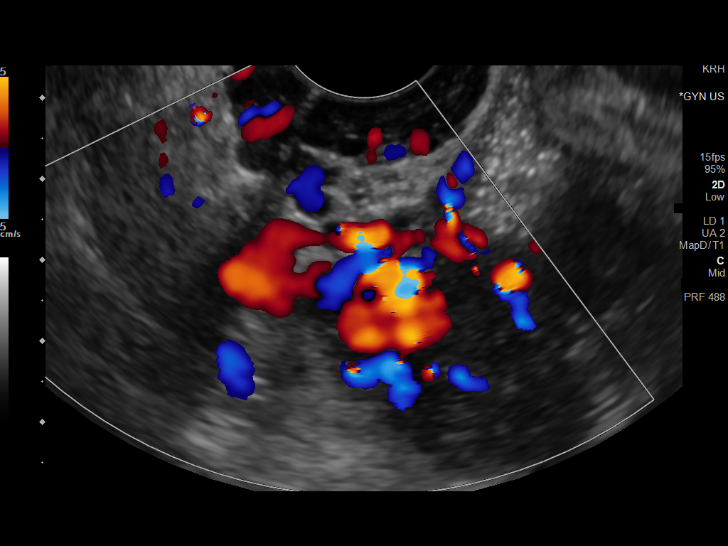
[im 61/74]
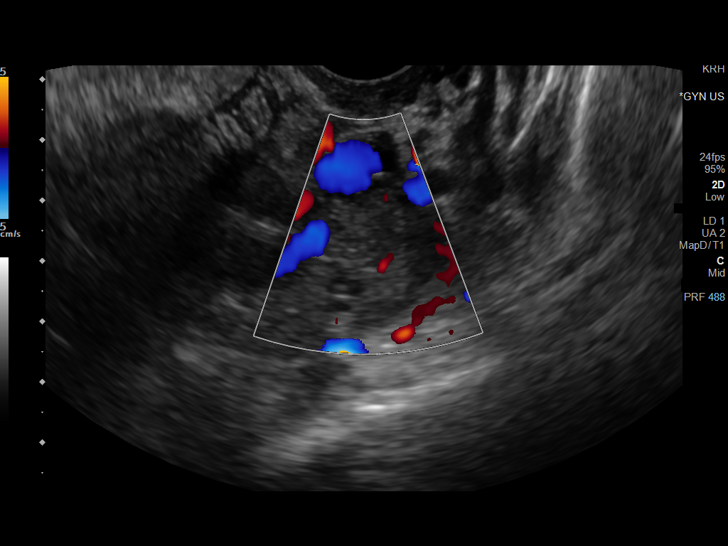
[im 67/74]
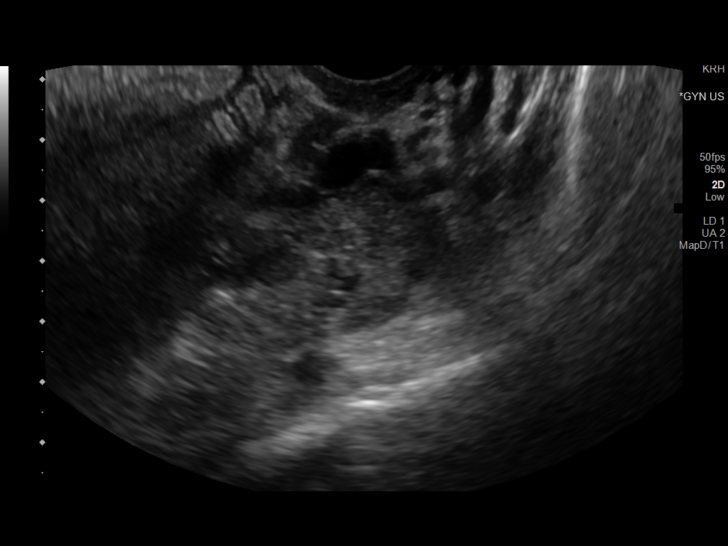
[im 74/74]
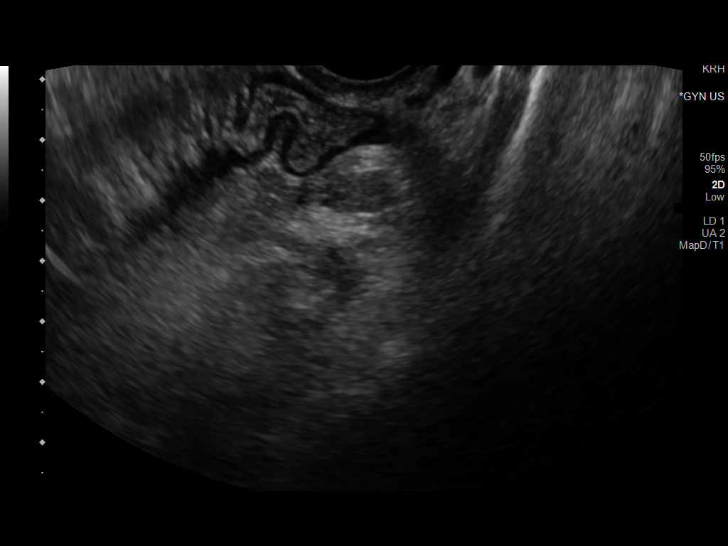

[13 of 25 positions shown; findings below may reference images not displayed]

FINDINGS: Uterus

Measurements: 9.5 x 4.9 x 5.0 cm = volume: 121.2 mL. Uterus is
retroflexed. No discrete fibroid or other mass.

Endometrium

Thickness: 13 mm.  No focal abnormality visualized.

Right ovary

Measurements: 3.2 x 1.7 x 2.1 cm = volume: 6.1 mL. Normal
appearance/no adnexal mass.

Left ovary

Measurements: 3.2 x 2.3 x 2.1 cm = volume: 8.1 mL. Normal
appearance/no adnexal mass.

Pulsed Doppler evaluation of both ovaries demonstrates normal
low-resistance arterial and venous waveforms.

Other findings

Trace free physiologic fluid present within the pelvis.

Mild prominence of the pelvic vasculature seen within the left
greater than right adnexa.
IMPRESSION: 1. Mild prominence of the pelvic vasculature within the left greater
than right adnexa, nonspecific, but can be seen in the setting of
pelvic congestion syndrome.
2. Otherwise unremarkable and normal pelvic ultrasound. No evidence
for torsion or other acute abnormality.

## 2022-12-22 LAB — CMP 10231: EGFR: 105

## 2023-02-05 ENCOUNTER — Ambulatory Visit: Payer: Managed Care, Other (non HMO) | Admitting: Internal Medicine

## 2023-02-25 NOTE — Progress Notes (Unsigned)
NEW PATIENT Date of Service/Encounter:  02/26/23 Referring provider: none-self referred Primary care provider: Patient, No Pcp Per  Subjective:  Deborah Berry is a 27 y.o. female presenting today for evaluation of recurrent infections, concern for immune deficiency. History obtained from: chart review and patient.   Discussed the use of AI scribe software for clinical note transcription with the patient, who gave verbal consent to proceed.  History of Present Illness   The patient, with a history of chronic health issues since the birth of her child five years ago, sought care from a provider in [place] due to lack of satisfactory answers from local providers. She has been experiencing chronic symptoms, including a low white blood cell count and high immunoglobulin M levels, first identified in April. The patient prefers a natural approach to treatment and followed a protocol involving supplements and lifestyle changes. However, upon retesting in August, the immunoglobulin M levels were found to be higher and the white blood cell count lower. The patient also contracted Fifths disease over the summer, which she believes may have affected the lab results.  The patient has a history of multiple infections since the birth of her child, including a uterine infection postpartum, mastitis twice within the first four weeks of her child's life, and suspected UTIs. She received multiple courses of antibiotics for these infections. However, outside of these instances, she has not had any other significant infections requiring antibiotics.  The patient also has a history of allergies, including seasonal allergies in spring and fall, which typically manifest as headaches. She has undergone allergy testing and shots in the past.  She has a known allergy to penicillin and amoxicillin, which causes a rash, and a food allergy to casein, which causes gastrointestinal upset. She avoids dairy in her diet.   She also avoids gluten.  The patient has a history of eczema, which was primarily triggered by chlorine exposure from swimming.  This is no longer a concern or issue.  She has received all her childhood vaccines and does not take any medications currently. She has noticed that when she does get sick, she tends to be ill for longer than others with the same illness.      Chart Review: reviewed on patient's phone LABS WBC: 3.0 (12/2022) Neutrophils: 1.5 (12/2022) Lymphocytes: 1.1 (12/2022) Monocytes: 0.3 (12/2022) Eosinophils: 0.1 (12/2022) IgM: 351 (12/2022) IgG: 1112 (12/2022) IgA: 168 (12/2022) IgE: 10 (12/2022)  Other allergy screening: Asthma: no History of recurrent infections suggestive of immunodeficency: no Vaccinations are up to date.  No COVID or flu vaccines, but has childhood vaccines  Past Medical History: No past medical history on file. Medication List:  Current Outpatient Medications  Medication Sig Dispense Refill   ibuprofen (ADVIL) 800 MG tablet Take 1 tablet (800 mg total) by mouth 3 (three) times daily. (Patient not taking: Reported on 02/26/2023) 21 tablet 0   No current facility-administered medications for this visit.   Known Allergies:  Allergies  Allergen Reactions   Penicillins Rash   Past Surgical History: No past surgical history on file. Family History: No family history on file. Social History: [place] lives in a house built around 20 years ago, no water damage, wood floors, gas heat, central AC, 1 dog, no roaches, using dust mite covers on the bed and the pillows, no smoke exposure.  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  + HEPA filter in the home.  [place]   ROS:  All other systems negative except as noted  per HPI.  Objective:  Blood pressure 110/62, pulse 81, temperature 99.3 F (37.4 C), temperature source Temporal, resp. rate 20, height 5' 6.5" (1.689 m), weight 118 lb 4.8 oz (53.7 kg), SpO2 99%. Body mass index is 18.81  kg/m. Physical Exam:  General Appearance:  Alert, cooperative, no distress, appears stated age  Head:  Normocephalic, without obvious abnormality, atraumatic  Eyes:  Conjunctiva clear, EOM's intact  Ears EACs normal bilaterally and normal TMs bilaterally  Nose: Nares normal, hypertrophic turbinates  Throat: Lips, tongue normal; teeth and gums normal, normal posterior oropharynx  Neck: Supple, symmetrical  Lungs:   clear to auscultation bilaterally, Respirations unlabored, no coughing  Heart:  regular rate and rhythm and no murmur, Appears well perfused  Extremities: No edema  Skin: Skin color, texture, turgor normal and no rashes or lesions on visualized portions of skin  Neurologic: No gross deficits   Diagnostics:  Labs:  Lab Orders         CBC with Differential/Platelet         Complement, total         Diphtheria / Tetanus Antibody Panel         IgG, IgA, IgM         Lymph Enumeration, Basic & NK Cells         Strep pneumoniae 23 Serotypes IgG       Assessment and Plan  Assessment and Plan    Chronic Low White Blood Cell Count Chronic low white blood cell count since 2017, with no recurrent infections or hospitalizations. Recent labs in August showed a further decrease, possibly related to a recent illness (Fifth disease). -Order complete blood count (CBC) to reassess white blood cell count. -Order lymph enumeration to evaluate B/T cells -Consider referral to an oncologist if white blood cell count remains low.  Elevated Immunoglobulin M (IgM) Only slightly elevated without infections concerning for PID.   Elevated IgM noted in April and August labs, with no associated symptoms or recurrent infections. -Repeat immunoglobulin panel to reassess IgM levels. -Check titers for diphtheria and tetanus to assess immune response to vaccines as well as strep pneumonia.  Dairy Intolerance History of casein allergy with gastrointestinal symptoms upon ingestion. Currently  avoiding dairy and gluten products. -No change in management.  H/O Penicillin allergy: - please schedule follow-up appt at your convenience for penicillin testing followed by graded oral challenge if indicated - please refrain from taking any antihistamines at least 3 days prior to this appointment  - around 80% of individuals outgrow this allergy in ~ 10 years and carrying it as a diagnosis can prevent you from getting proper therapy if needed  General Health Maintenance -Consider Pneumovax 23 vaccine depending on results of strep pneumonia titers.     Follow up : pending results It was a pleasure meeting you in clinic today! Thank you for allowing me to participate in your care.  Tonny Bollman, MD [facility]  This note in its entirety was forwarded to the Provider who requested this consultation.  Other: none  Thank you for your kind referral. I appreciate the opportunity to take part in Lanissa's care. Please do not hesitate to contact me with questions.  Sincerely,  Tonny Bollman, MD [facility]

## 2023-02-26 ENCOUNTER — Encounter: Payer: Self-pay | Admitting: Internal Medicine

## 2023-02-26 ENCOUNTER — Telehealth: Payer: Self-pay

## 2023-02-26 ENCOUNTER — Other Ambulatory Visit: Payer: Self-pay

## 2023-02-26 ENCOUNTER — Ambulatory Visit (INDEPENDENT_AMBULATORY_CARE_PROVIDER_SITE_OTHER): Payer: Managed Care, Other (non HMO) | Admitting: Internal Medicine

## 2023-02-26 VITALS — BP 110/62 | HR 81 | Temp 99.3°F | Resp 20 | Ht 66.5 in | Wt 118.3 lb

## 2023-02-26 DIAGNOSIS — B999 Unspecified infectious disease: Secondary | ICD-10-CM

## 2023-02-26 DIAGNOSIS — D72810 Lymphocytopenia: Secondary | ICD-10-CM | POA: Diagnosis not present

## 2023-02-26 DIAGNOSIS — Z880 Allergy status to penicillin: Secondary | ICD-10-CM | POA: Diagnosis not present

## 2023-02-26 DIAGNOSIS — T781XXA Other adverse food reactions, not elsewhere classified, initial encounter: Secondary | ICD-10-CM

## 2023-02-26 DIAGNOSIS — T781XXD Other adverse food reactions, not elsewhere classified, subsequent encounter: Secondary | ICD-10-CM

## 2023-02-26 NOTE — Patient Instructions (Addendum)
Chronic Low White Blood Cell Count Chronic low white blood cell count since 2017, with no recurrent infections or hospitalizations. Recent labs in August showed a further decrease, possibly related to a recent illness (Fifth disease). -Order complete blood count (CBC) to reassess white blood cell count. -Order lymph enumeration to evaluate B/T cells -Consider referral to an oncologist if white blood cell count remains low.  Elevated Immunoglobulin M (IgM) Elevated IgM noted in April and August labs, with no associated symptoms or recurrent infections. -Repeat immunoglobulin panel to reassess IgM levels. -Check titers for diphtheria and tetanus to assess immune response to vaccines as well as strep pneumonia.  Dairy Intolerance History of casein allergy with gastrointestinal symptoms upon ingestion. Currently avoiding dairy and gluten products. -No change in management.  H/O Penicillin allergy: - please schedule follow-up appt at your convenience for penicillin testing followed by graded oral challenge if indicated - please refrain from taking any antihistamines at least 3 days prior to this appointment  - around 80% of individuals outgrow this allergy in ~ 10 years and carrying it as a diagnosis can prevent you from getting proper therapy if needed   General Health Maintenance -Consider Pneumovax 23 vaccine depending on results of strep pneumonia titers.     Follow up : pending results It was a pleasure meeting you in clinic today! Thank you for allowing me to participate in your care.

## 2023-02-26 NOTE — Telephone Encounter (Signed)
Faxed release of medical records [facility] [place] . Called and spoke to Char at [phone] verified pt is extablished there and provider ( she is on maternity leave) faxed release to [phone]

## 2023-03-02 LAB — LYMPH ENUMERATION, BASIC & NK CELLS
% CD 3 Pos. Lymph.: 86 % (ref 57.5–86.2)
% CD 4 Pos. Lymph.: 49.9 % (ref 30.8–58.5)
% NK (CD56/16): 5.5 % (ref 1.4–19.4)
Ab NK (CD56/16): 61 /uL (ref 24–406)
Absolute CD 3: 946 /uL (ref 622–2402)
Absolute CD 4 Helper: 549 /uL (ref 359–1519)
Basophils Absolute: 0 10*3/uL (ref 0.0–0.2)
Basos: 0 %
CD19 % B Cell: 7.5 % (ref 3.3–25.4)
CD19 Abs: 83 /uL (ref 12–645)
CD4/CD8 Ratio: 1.43 (ref 0.92–3.72)
CD8 % Suppressor T Cell: 34.9 % (ref 12.0–35.5)
CD8 T Cell Abs: 384 /uL (ref 109–897)
EOS (ABSOLUTE): 0 10*3/uL (ref 0.0–0.4)
Eos: 0 %
Hematocrit: 38.9 % (ref 34.0–46.6)
Hemoglobin: 12.5 g/dL (ref 11.1–15.9)
Immature Grans (Abs): 0 10*3/uL (ref 0.0–0.1)
Immature Granulocytes: 0 %
Lymphocytes Absolute: 1.1 10*3/uL (ref 0.7–3.1)
Lymphs: 16 %
MCH: 32.2 pg (ref 26.6–33.0)
MCHC: 32.1 g/dL (ref 31.5–35.7)
MCV: 100 fL — ABNORMAL HIGH (ref 79–97)
Monocytes Absolute: 0.6 10*3/uL (ref 0.1–0.9)
Monocytes: 8 %
Neutrophils Absolute: 5.4 10*3/uL (ref 1.4–7.0)
Neutrophils: 76 %
Platelets: 183 10*3/uL (ref 150–450)
RBC: 3.88 x10E6/uL (ref 3.77–5.28)
RDW: 11.2 % — ABNORMAL LOW (ref 11.7–15.4)
WBC: 7.2 10*3/uL (ref 3.4–10.8)

## 2023-03-02 LAB — STREP PNEUMONIAE 23 SEROTYPES IGG
Pneumo Ab Type 1*: 0.1 ug/mL — ABNORMAL LOW (ref >1.3)
Pneumo Ab Type 12 (12F)*: <0.1 ug/mL — ABNORMAL LOW (ref >1.3)
Pneumo Ab Type 14*: 0.6 ug/mL — ABNORMAL LOW (ref >1.3)
Pneumo Ab Type 17 (17F)*: 2 ug/mL (ref >1.3)
Pneumo Ab Type 19 (19F)*: 9.5 ug/mL (ref >1.3)
Pneumo Ab Type 2*: 0.8 ug/mL — ABNORMAL LOW (ref >1.3)
Pneumo Ab Type 20*: 2.8 ug/mL (ref >1.3)
Pneumo Ab Type 22 (22F)*: 1.7 ug/mL (ref >1.3)
Pneumo Ab Type 23 (23F)*: 1.6 ug/mL (ref >1.3)
Pneumo Ab Type 26 (6B)*: 0.7 ug/mL — ABNORMAL LOW (ref >1.3)
Pneumo Ab Type 3*: 0.3 ug/mL — ABNORMAL LOW (ref >1.3)
Pneumo Ab Type 34 (10A)*: 1.3 ug/mL — ABNORMAL LOW (ref >1.3)
Pneumo Ab Type 4*: 0.2 ug/mL — ABNORMAL LOW (ref >1.3)
Pneumo Ab Type 43 (11A)*: 2.1 ug/mL (ref >1.3)
Pneumo Ab Type 5*: 0.3 ug/mL — ABNORMAL LOW (ref >1.3)
Pneumo Ab Type 51 (7F)*: 1.5 ug/mL (ref >1.3)
Pneumo Ab Type 54 (15B)*: 4.5 ug/mL (ref >1.3)
Pneumo Ab Type 56 (18C)*: 0.6 ug/mL — ABNORMAL LOW (ref >1.3)
Pneumo Ab Type 57 (19A)*: 4.6 ug/mL (ref >1.3)
Pneumo Ab Type 68 (9V)*: 0.1 ug/mL — ABNORMAL LOW (ref >1.3)
Pneumo Ab Type 70 (33F)*: 1.1 ug/mL — ABNORMAL LOW (ref >1.3)
Pneumo Ab Type 8*: 0.6 ug/mL — ABNORMAL LOW (ref >1.3)
Pneumo Ab Type 9 (9N)*: 0.2 ug/mL — ABNORMAL LOW (ref >1.3)

## 2023-03-02 LAB — IGG, IGA, IGM
IgA/Immunoglobulin A, Serum: 180 mg/dL (ref 87–352)
IgG (Immunoglobin G), Serum: 1058 mg/dL (ref 586–1602)
IgM (Immunoglobulin M), Srm: 300 mg/dL — ABNORMAL HIGH (ref 26–217)

## 2023-03-02 LAB — DIPHTHERIA / TETANUS ANTIBODY PANEL
Diphtheria Ab: 0.52 [IU]/mL (ref <0.10)
Tetanus Ab, IgG: 1.81 [IU]/mL (ref <0.10)

## 2023-03-02 LAB — COMPLEMENT, TOTAL: Compl, Total (CH50): 47 U/mL (ref >41)

## 2023-03-03 NOTE — Telephone Encounter (Signed)
Records received and placed on Dr Maurine Minister desk for review

## 2023-03-03 NOTE — Progress Notes (Signed)
Please let Deborah Berry know that her immune evaluation looks great. Her white counts are within normal range.  Her IgM is slightly elevated, but this is not in an alarming range.   I would only be concerned if it were much much higher or if it were very low. She does have great response to prior vaccines of diptheria and tetanus.  She has partial protection against pneumonia, but as we discussed, you have never had the Pneumovax 23 vaccine, so it is not unusual to have titers that look like hers. I am not concerned about immune deficiency, but if you would like to get the Pneumovax 23 vaccine (we could give in our clinic), we could repeat your titers in 6 weeks to ensure you make an adequate response.  Otherwise, I would not recommend any further immune evaluation at this time.   Please let me know if questions.

## 2023-03-05 ENCOUNTER — Other Ambulatory Visit: Payer: Self-pay

## 2023-03-20 ENCOUNTER — Ambulatory Visit: Payer: Managed Care, Other (non HMO) | Admitting: Internal Medicine

## 2023-10-30 LAB — OB RESULTS CONSOLE RPR: RPR: NONREACTIVE

## 2023-10-30 LAB — OB RESULTS CONSOLE HEPATITIS B SURFACE ANTIGEN: Hepatitis B Surface Ag: NEGATIVE

## 2023-10-30 LAB — OB RESULTS CONSOLE GC/CHLAMYDIA
Chlamydia: NEGATIVE
Neisseria Gonorrhea: NEGATIVE

## 2024-05-04 LAB — OB RESULTS CONSOLE GBS: GBS: POSITIVE

## 2024-05-06 NOTE — L&D Delivery Note (Signed)
" ° °  Delivery Note:   [id number] at [redacted]w[redacted]d  Admitting diagnosis: Normal labor [O80, Z37.9] Risks: Vaginal bleeding at term; GBS positive, declined IP antibiotics Onset of labor: 05/23/2024 at 0000 IOL/Augmentation: N/A ROM: 05/23/2024 at 2344, light meconium  Complete dilation at 05/23/2024 2342 Onset of pushing at 2342 FHR second stage Cat I  Analgesia/Anesthesia intrapartum:Epidural Pushing in lithotomy and side lying position with CNM and L&D staff support. Husband, Emeline, and doula, Vernell, present for birth and supportive.  Delivery of a Live born female  Birth Weight:  pending APGAR: 9, 9  Newborn Delivery   Birth date/time: 05/23/2024 23:57:00 Delivery type: Vaginal, Spontaneous    in cephalic presentation, position OA to LOA.  APGAR:1 min-9 , 5 min-9   Nuchal Cord: No  Cord double clamped after cessation of pulsation, cut by Emeline.  Collection of cord blood for typing completed. Arterial cord blood sample-No   Placenta delivered-Spontaneous with 3 vessels. Uterotonics: Pitocin , methergine , TXA Placenta to L&D Uterine tone firm  Bleeding initially brisk, firm with fundal rubs, uterotonics given, scant thereafter  None laceration identified.  Episiotomy:None Local analgesia: N/A  Repair: N/A Est. Blood Loss (mL):976.00  Complications: None  Mom to postpartum. Baby Pamla to Couplet care / Skin to Skin.  Delivery Report:   Review the Delivery Report for details.    Alan MARLA Molt, CNM, MSN 05/24/2024, 12:45 AM   "

## 2024-05-23 ENCOUNTER — Encounter (HOSPITAL_COMMUNITY): Payer: Self-pay | Admitting: Obstetrics & Gynecology

## 2024-05-23 ENCOUNTER — Inpatient Hospital Stay (HOSPITAL_COMMUNITY): Admitting: Anesthesiology

## 2024-05-23 ENCOUNTER — Other Ambulatory Visit: Payer: Self-pay

## 2024-05-23 ENCOUNTER — Inpatient Hospital Stay (HOSPITAL_COMMUNITY)
Admission: AD | Admit: 2024-05-23 | Discharge: 2024-05-25 | DRG: 806 | Disposition: A | Discharge status: Home / Self Care | Attending: Obstetrics & Gynecology | Admitting: Obstetrics & Gynecology

## 2024-05-23 DIAGNOSIS — O99824 Streptococcus B carrier state complicating childbirth: Principal | ICD-10-CM | POA: Diagnosis present

## 2024-05-23 DIAGNOSIS — D62 Acute posthemorrhagic anemia: Secondary | ICD-10-CM | POA: Diagnosis not present

## 2024-05-23 DIAGNOSIS — O26893 Other specified pregnancy related conditions, third trimester: Secondary | ICD-10-CM | POA: Diagnosis present

## 2024-05-23 DIAGNOSIS — B951 Streptococcus, group B, as the cause of diseases classified elsewhere: Secondary | ICD-10-CM | POA: Diagnosis present

## 2024-05-23 DIAGNOSIS — Z3A39 39 weeks gestation of pregnancy: Secondary | ICD-10-CM | POA: Diagnosis not present

## 2024-05-23 DIAGNOSIS — Z880 Allergy status to penicillin: Secondary | ICD-10-CM

## 2024-05-23 DIAGNOSIS — O9081 Anemia of the puerperium: Secondary | ICD-10-CM | POA: Diagnosis not present

## 2024-05-23 LAB — URINALYSIS, ROUTINE W REFLEX MICROSCOPIC
Bilirubin Urine: NEGATIVE
Glucose, UA: NEGATIVE mg/dL
Hgb urine dipstick: NEGATIVE
Ketones, ur: 20 mg/dL — AB
Leukocytes,Ua: NEGATIVE
Nitrite: NEGATIVE
Protein, ur: NEGATIVE mg/dL
Specific Gravity, Urine: 1.006 (ref 1.005–1.030)
pH: 7 (ref 5.0–8.0)

## 2024-05-23 LAB — SYPHILIS: RPR W/REFLEX TO RPR TITER AND TREPONEMAL ANTIBODIES, TRADITIONAL SCREENING AND DIAGNOSIS ALGORITHM: RPR Ser Ql: NONREACTIVE

## 2024-05-23 LAB — CBC
HCT: 39.5 % (ref 36.0–46.0)
Hemoglobin: 13.4 g/dL (ref 12.0–15.0)
MCH: 34.2 pg — ABNORMAL HIGH (ref 26.0–34.0)
MCHC: 33.9 g/dL (ref 30.0–36.0)
MCV: 100.8 fL — ABNORMAL HIGH (ref 80.0–100.0)
Platelets: 148 K/uL — ABNORMAL LOW (ref 150–400)
RBC: 3.92 MIL/uL (ref 3.87–5.11)
RDW: 12 % (ref 11.5–15.5)
WBC: 8.8 K/uL (ref 4.0–10.5)
nRBC: 0 % (ref 0.0–0.2)

## 2024-05-23 LAB — TYPE AND SCREEN
ABO/RH(D): A POS
Antibody Screen: NEGATIVE

## 2024-05-23 MED ORDER — EPHEDRINE 5 MG/ML INJ: 10.0000 mg | INTRAVENOUS | Status: DC | PRN

## 2024-05-23 MED ORDER — FLEET ENEMA RE ENEM: 1.0000 | ENEMA | RECTAL | Status: DC | PRN

## 2024-05-23 MED ORDER — LIDOCAINE HCL (PF) 1 % IJ SOLN: 30.0000 mL | INTRAMUSCULAR | Status: DC | PRN

## 2024-05-23 MED ORDER — PHENYLEPHRINE 80 MCG/ML (10ML) SYRINGE FOR IV PUSH (FOR BLOOD PRESSURE SUPPORT)
80.0000 ug | PREFILLED_SYRINGE | INTRAVENOUS | Status: DC | PRN
  Filled 2024-05-23: qty 10

## 2024-05-23 MED ORDER — OXYTOCIN-SODIUM CHLORIDE 30-0.9 UT/500ML-% IV SOLN
2.5000 [IU]/h | INTRAVENOUS | Status: DC
  Filled 2024-05-23: qty 500

## 2024-05-23 MED ORDER — ACETAMINOPHEN 325 MG PO TABS: 650.0000 mg | ORAL_TABLET | ORAL | Status: DC | PRN

## 2024-05-23 MED ORDER — LACTATED RINGERS IV SOLN
500.0000 mL | Freq: Once | INTRAVENOUS | Status: AC
  Administered 2024-05-23: 500 mL via INTRAVENOUS

## 2024-05-23 MED ORDER — OXYTOCIN BOLUS FROM INFUSION
333.0000 mL | Freq: Once | INTRAVENOUS | Status: AC
  Administered 2024-05-24: 333 mL via INTRAVENOUS

## 2024-05-23 MED ORDER — OXYCODONE-ACETAMINOPHEN 5-325 MG PO TABS: 2.0000 | ORAL_TABLET | ORAL | Status: DC | PRN

## 2024-05-23 MED ORDER — OXYCODONE-ACETAMINOPHEN 5-325 MG PO TABS: 1.0000 | ORAL_TABLET | ORAL | Status: DC | PRN

## 2024-05-23 MED ORDER — FENTANYL-BUPIVACAINE-NACL 0.5-0.125-0.9 MG/250ML-% EP SOLN
12.0000 mL/h | EPIDURAL | Status: DC | PRN
  Administered 2024-05-23: 12 mL/h via EPIDURAL
  Filled 2024-05-23: qty 250

## 2024-05-23 MED ORDER — LACTATED RINGERS IV SOLN
500.0000 mL | INTRAVENOUS | Status: DC | PRN
  Administered 2024-05-24: 1000 mL via INTRAVENOUS

## 2024-05-23 MED ORDER — PHENYLEPHRINE 80 MCG/ML (10ML) SYRINGE FOR IV PUSH (FOR BLOOD PRESSURE SUPPORT): 80.0000 ug | PREFILLED_SYRINGE | INTRAVENOUS | Status: DC | PRN

## 2024-05-23 MED ORDER — LIDOCAINE HCL (PF) 1 % IJ SOLN
INTRAMUSCULAR | Status: DC | PRN
  Administered 2024-05-23: 5 mL via EPIDURAL

## 2024-05-23 MED ORDER — SOD CITRATE-CITRIC ACID 500-334 MG/5ML PO SOLN: 30.0000 mL | ORAL | Status: DC | PRN

## 2024-05-23 MED ORDER — LACTATED RINGERS IV SOLN: INTRAVENOUS | Status: DC

## 2024-05-23 MED ORDER — ONDANSETRON HCL 4 MG/2ML IJ SOLN: 4.0000 mg | Freq: Four times a day (QID) | INTRAMUSCULAR | Status: DC | PRN

## 2024-05-23 MED ORDER — DIPHENHYDRAMINE HCL 50 MG/ML IJ SOLN: 12.5000 mg | INTRAMUSCULAR | Status: DC | PRN

## 2024-05-23 NOTE — Progress Notes (Signed)
 Alan Molt, CNM at bedside. Delora Millman asked if patient could use their personal heating pack for shoulder pain/tingling. Alan, CNM stated that she was fine with that. RN educated that epidural can decrease sensation to temperature. Needed to ensure that heating pack stays at shoulder level. RN will continue to monitor.

## 2024-05-23 NOTE — Progress Notes (Signed)
 Deborah Berry is a 29 y.o. [phone] at [redacted]w[redacted]d here for early labor, heavy bloody show. CNM pt. Low intervention plan.  GBS pos, per CNM JOnes, pt declined PCN prophylaxis  BP 108/62   Pulse 85   Temp 98.2 F (36.8 C) (Oral)   Resp 20   Ht 5' 6 (1.676 m)   Wt 64.4 kg   SpO2 100%   BMI 22.92 kg/m   FHT:  FHR: 135 bpm, variability: moderate,  accelerations:  Present,  decelerations:  Absent UC:   regular, not picking up SVE:   Dilation: 3.5 Effacement (%): 70 Station: -3 Exam by:: Derrek Freund, RN Deferred repeat exam on L&D admit  Labs: Lab Results  Component Value Date   WBC 8.8 05/23/2024   HGB 13.4 05/23/2024   HCT 39.5 05/23/2024   MCV 100.8 (H) 05/23/2024   PLT 148 (L) 05/23/2024    Assessment / Plan: Spontaneous labor, progressing normally latent labor but admitted due to reported heavy bleeding. FHT cat I, though need more tracing to assess.[name] Berry for now due to bleeding    Deborah JONELLE Render, MD 05/23/2024, 8:54 AM

## 2024-05-23 NOTE — Anesthesia Preprocedure Evaluation (Signed)
 "                                  Anesthesia Evaluation  Patient identified by MRN, date of birth, ID band Patient awake    Reviewed: Allergy & Precautions, NPO status , Patient's Chart, lab work & pertinent test results  Airway Mallampati: II  TM Distance: >3 FB Neck ROM: Full    Dental no notable dental hx. (+) Teeth Intact, Dental Advisory Given   Pulmonary neg pulmonary ROS   Pulmonary exam normal breath sounds clear to auscultation       Cardiovascular negative cardio ROS Normal cardiovascular exam Rhythm:Regular Rate:Normal     Neuro/Psych negative neurological ROS  negative psych ROS   GI/Hepatic Neg liver ROS,,,  Endo/Other  negative endocrine ROS    Renal/GU negative Renal ROS     Musculoskeletal negative musculoskeletal ROS (+)    Abdominal   Peds  Hematology Lab Results      Component                Value               Date                      WBC                      8.8                 05/23/2024                HGB                      13.4                05/23/2024                HCT                      39.5                05/23/2024                MCV                      100.8 (H)           05/23/2024                PLT                      148 (L)             05/23/2024              Anesthesia Other Findings All: PCN  Reproductive/Obstetrics (+) Pregnancy                              Anesthesia Physical Anesthesia Plan  ASA: 2  Anesthesia Plan: Epidural   Post-op Pain Management:    Induction:   PONV Risk Score and Plan:   Airway Management Planned:   Additional Equipment:   Intra-op Plan:   Post-operative Plan:   Informed Consent: I have reviewed the patients History and Physical, chart, labs and discussed the procedure including the risks, benefits and  alternatives for the proposed anesthesia with the patient or authorized representative who has indicated his/her understanding and  acceptance.       Plan Discussed with:   Anesthesia Plan Comments: (39.3 G4P1 for LEA)         Anesthesia Quick Evaluation  "

## 2024-05-23 NOTE — H&P (Addendum)
 "  OB ADMISSION/ HISTORY & PHYSICAL:  Admission Date: 05/23/2024  6:27 AM  Admit Diagnosis: Normal labor [O80, Z37.9]    Deborah Berry is a 29 y.o. female [phone] at [redacted]w[redacted]d presenting for vaginal bleeding and contractions. Patient states she has been bleeding like a period since midnight. SVE in MAU 3-4/70/-3. Admitted for vaginal bleeding, cannot rule out placental abruption. Denies leaking of fluid. Endorses + fetal movement. Husband, Jerred, present and supportive. Eagerly anticipating a baby boy Judah.   Prenatal History: [id number]   EDC: 05/27/2024 Prenatal care at [facility] since 10 weeks  Primary: A. Joshua, CNM  Prenatal course complicated by: Anemia, on PO iron , declined IV iron  GBS positive, extensively counseled in the office and on admission for PCN prophylaxis and patient declines PCN History of PP endometritis, was GBS positive and declined treatment Pruritus, negative bile acids multiple times, last was 4 at 38 weeks, declined anti-histamines  Prenatal Labs: ABO, Rh:   A POS Antibody: NEG (01/18 0805) Rubella:   Immune RPR: Nonreactive (06/26 0000)  HBsAg: Negative (06/26 0000)  HIV:   Negative GBS: Positive/-- (12/30 0000)  1 hr Glucola : Declined, normal CBGs at 28 and 32 weeks Genetic Screening: Declines Ultrasound: normal XY anatomy, anterior placenta  Prenatal Transfer Tool  Maternal Diabetes: No Genetic Screening: Declined Maternal Ultrasounds/Referrals: Normal Fetal Ultrasounds or other Referrals:  None Maternal Substance Abuse:  No Significant Maternal Medications:  None Significant Maternal Lab Results: Group B Strep positive Number of Prenatal Visits:greater than 3 verified prenatal visits Maternal Vaccinations: Declined all vaccines Other Comments:  None   Medical / Surgical History : Past medical history: History reviewed. No pertinent past medical history.  Past surgical history: History reviewed. No pertinent surgical history.  Family  History: History reviewed. No pertinent family history.  Social History:  reports that she has never smoked. She has never used smokeless tobacco. She reports that she does not drink alcohol and does not use drugs.  Allergies: Penicillins   Current Medications at time of admission:  Medications Prior to Admission  Medication Sig Dispense Refill Last Dose/Taking   ibuprofen  (ADVIL ) 800 MG tablet Take 1 tablet (800 mg total) by mouth 3 (three) times daily. (Patient not taking: Reported on 02/26/2023) 21 tablet 0     Review of Systems: Review of Systems  All other systems reviewed and are negative.  Physical Exam: Vital signs and nursing notes reviewed.  Patient Vitals for the past 24 hrs:  BP Temp Temp src Pulse Resp SpO2 Height Weight  05/23/24 0945 110/67 -- -- 85 16 -- -- --  05/23/24 0818 108/62 98.2 F (36.8 C) Oral 85 20 -- -- --  05/23/24 0745 -- -- -- -- -- 100 % -- --  05/23/24 0740 -- -- -- -- -- 100 % -- --  05/23/24 0735 -- -- -- -- -- 100 % -- --  05/23/24 0730 -- -- -- -- -- 100 % -- --  05/23/24 0725 -- -- -- -- -- 100 % -- --  05/23/24 0720 -- -- -- -- -- 99 % -- --  05/23/24 0715 -- -- -- -- -- 98 % -- --  05/23/24 0710 -- -- -- -- -- 99 % -- --  05/23/24 0709 108/65 -- -- 90 -- -- -- --  05/23/24 0705 -- -- -- -- -- 99 % -- --  05/23/24 0700 114/72 -- -- 83 -- 99 % -- --  05/23/24 0658 114/72 -- -- 83 -- 99 % -- --  05/23/24 0652 -- 98.2 F (36.8 C) Oral 89 18 100 % 5' 6 (1.676 m) 64.4 kg    General: AAO x 3, NAD Heart: RRR Lungs:CTAB Abdomen: Gravid, NT Extremities: no edema SVE: Dilation: 3.5 Effacement (%): 70 Station: -3 Presentation: Vertex Exam by:: Derrek Freund, RN   FHR: 140BPM, moderate variability, + accels, no decels TOCO: Contractions q 1-6 minutes  Labs:   Recent Labs    05/23/24 0805  WBC 8.8  HGB 13.4  HCT 39.5  PLT 148*   Assessment/Plan: 28 y.o. H5E8978 at [redacted]w[redacted]d, early labor with frank red bleeding, cannot rule out  placental abruption GBS positive, declines PCN after extensive counseling at the office and on admission Anemia, on PO iron , admission Hgb 13.4  Fetal wellbeing - FHT category 1 EFW AGA 7-8lbs  Labor: Desires expectant management, will check SVE 6 hours after admission SVE and if no progress, plan AROM; discussed Pitocin  versus AROM extensively with patient and GBS+ with no PCN and patient accepts risk of AROM with no PCN for augmentation of labor, plan active management of the third stage for possible abruption, continuous EFM  GBS positive, declines PCN Rubella immune Rh positive  Pain control: desires unmedicated birth Analgesia/anesthesia PRN  Anticipated MOD: NSVB  Plans to breastfeed.  POC discussed with patient and support team, all questions answered.  Dr. Barbette notified of admission/plan of care.  Alan MARLA Molt CNM, MSN 05/23/2024, 10:41 AM    Addendum: Patient with allergy to PCN, causes rash. Patient currently declining IV antibiotics for GBS prophylaxis but ok for Ancef if patient consents to antibiotics.  "

## 2024-05-23 NOTE — Anesthesia Procedure Notes (Signed)
 Epidural Patient location during procedure: OB Start time: 05/23/2024 6:58 PM End time: 05/23/2024 7:12 PM  Staffing Anesthesiologist: Jefm Garnette LABOR, MD Performed: anesthesiologist   Preanesthetic Checklist Completed: patient identified, IV checked, site marked, risks and benefits discussed, surgical consent, monitors and equipment checked, pre-op evaluation and timeout performed  Epidural Patient position: sitting Prep: DuraPrep and site prepped and draped Patient monitoring: continuous pulse ox and blood pressure Approach: midline Location: L3-L4 Injection technique: LOR air  Needle:  Needle type: Tuohy  Needle gauge: 17 G Needle length: 9 cm and 9 Needle insertion depth: 4 cm Catheter type: closed end flexible Catheter size: 19 Gauge Test dose: negative  Assessment Events: blood not aspirated, no cerebrospinal fluid, injection not painful, no injection resistance, no paresthesia and negative IV test  Additional Notes Patient identified. Risks/Benefits/Options discussed with patient including but not limited to bleeding, infection, nerve damage, paralysis, failed block, incomplete pain control, headache, blood pressure changes, nausea, vomiting, reactions to medication both or allergic, itching and postpartum back pain. Confirmed with bedside nurse the patient's most recent platelet count. Confirmed with patient that they are not currently taking any anticoagulation, have any bleeding history or any family history of bleeding disorders. Patient expressed understanding and wished to proceed. All questions were answered. Sterile technique was used throughout the entire procedure. Please see nursing notes for vital signs. Test dose was given through epidural needle and negative prior to continuing to dose epidural or start infusion. Warning signs of high block given to the patient including shortness of breath, tingling/numbness in hands, complete motor block, or any concerning  symptoms with instructions to call for help. Patient was given instructions on fall risk and not to get out of bed. All questions and concerns addressed with instructions to call with any issues.  1 Attempt (S) . Patient tolerated procedure well.

## 2024-05-23 NOTE — Progress Notes (Signed)
 Article given to pt regarding taking encapsulated placenta infected with GBS. Pt stated she was not planning on taking encapsulated placenta after delivery

## 2024-05-23 NOTE — Progress Notes (Signed)
 S: Tearful and indecisive about decisions regarding management of labor. Multiple conversations regarding AROM, Pitocin , and Ancef for GBS prophylaxis. Patient states she is tired and has no energy for active labor.   O: Vitals:   05/23/24 1553 05/23/24 1702 05/23/24 1703 05/23/24 1705  BP: (!) 102/48 104/64    Pulse: 72 (!) 108    Resp: 18 (P) 19 (P) 20 20  Temp: 98.1 F (36.7 C)     TempSrc: Oral     SpO2:      Weight: 64.4 kg     Height: 5' 6 (1.676 m)      FHT:  FHR: 140 bpm, variability: moderate,  accelerations:  Present,  decelerations:  Absent UC:   regular, every 1-4 minutes SVE:   Dilation: 5 Effacement (%): 90 Station: -2 Exam by:: Alan Molt CNM  A / P: Protracted latent phase, scant vaginal bleeding since admission  Fetal Wellbeing:  Category I GBS: Positive, declines IV antibiotics Pain Control:  Labor support without medications Anticipated MOD:  NSVD  Patient desires epidural after extensive conversation regarding options for pain relief and augmentation of labor. Will AROM after comfortable with epidural.   Dr. Kandyce updated on patient status and plan of care.   Alan MARLA Molt, CNM, MSN 05/23/2024, 6:30 PM

## 2024-05-23 NOTE — Progress Notes (Signed)
 CNM Jones and I have spoken few times about this patient CNM concerned about excessive bloody show, possible marginal abruption and advising AROM and proceeding w/ delivery. Pt desiring minimal intervention and if medical team that worried about abruption, then she rather have C/s over augmentation.  She is also declines PCN for +GBS and has been counseled on neonatal risk. At present FHT is cat I , unable to pick up UCs as pt is working on her labor with position changes.  I am not concerned about need for C/section and will continue expectant management as she desires. AROM would be very minimal almost natural intervention, pt told CNM she may agree to that later.  --V.Ezechiel Stooksbury MD

## 2024-05-23 NOTE — Progress Notes (Signed)
 RN asked pt and husband if they would like to talk to a neonatal NP.  Pt decline stated that she feels like they researched the subject and believe that they understand the risks involved

## 2024-05-23 NOTE — MAU Note (Signed)
 Deborah Berry is a 29 y.o. at [redacted]w[redacted]d here in MAU reporting: vaginal bleeding since midnight. Patient states that bleeding is between light and moderate. Also reports having some ctxs that started yesterday afternoon that are now irregular. Recent IC was 2 days ago. Denies LOF. +FM   Onset of complaint: Bleeding at midnight Pain score: 2/10 Vitals:   05/23/24 0652  Pulse: 89  Resp: 18  Temp: 98.2 F (36.8 C)  SpO2: 100%     FHT: 142  Lab orders placed from triage: urine

## 2024-05-23 NOTE — Progress Notes (Signed)
 BP and adjusting monitor held d/t pt requesting limited interuptions at 1300

## 2024-05-24 ENCOUNTER — Encounter (HOSPITAL_COMMUNITY): Payer: Self-pay | Admitting: Obstetrics & Gynecology

## 2024-05-24 DIAGNOSIS — B951 Streptococcus, group B, as the cause of diseases classified elsewhere: Secondary | ICD-10-CM | POA: Diagnosis present

## 2024-05-24 LAB — CULTURE, OB URINE: Culture: NO GROWTH

## 2024-05-24 LAB — CBC
HCT: 31.6 % — ABNORMAL LOW (ref 36.0–46.0)
Hemoglobin: 10.9 g/dL — ABNORMAL LOW (ref 12.0–15.0)
MCH: 34.6 pg — ABNORMAL HIGH (ref 26.0–34.0)
MCHC: 34.5 g/dL (ref 30.0–36.0)
MCV: 100.3 fL — ABNORMAL HIGH (ref 80.0–100.0)
Platelets: 133 K/uL — ABNORMAL LOW (ref 150–400)
RBC: 3.15 MIL/uL — ABNORMAL LOW (ref 3.87–5.11)
RDW: 11.9 % (ref 11.5–15.5)
WBC: 18.2 K/uL — ABNORMAL HIGH (ref 4.0–10.5)
nRBC: 0 % (ref 0.0–0.2)

## 2024-05-24 MED ORDER — IRON SUCROSE 500 MG IVPB - SIMPLE MED
500.0000 mg | Freq: Once | INTRAVENOUS | Status: DC
  Filled 2024-05-24: qty 275

## 2024-05-24 MED ORDER — TRANEXAMIC ACID-NACL 1000-0.7 MG/100ML-% IV SOLN
1000.0000 mg | INTRAVENOUS | Status: AC
  Administered 2024-05-24: 1000 mg via INTRAVENOUS

## 2024-05-24 MED ORDER — TRANEXAMIC ACID-NACL 1000-0.7 MG/100ML-% IV SOLN
INTRAVENOUS | Status: AC
  Filled 2024-05-24: qty 100

## 2024-05-24 MED ORDER — TETANUS-DIPHTH-ACELL PERTUSSIS 5-2-15.5 LF-MCG/0.5 IM SUSP: 0.5000 mL | Freq: Once | INTRAMUSCULAR | Status: DC

## 2024-05-24 MED ORDER — METHYLERGONOVINE MALEATE 0.2 MG/ML IJ SOLN
INTRAMUSCULAR | Status: AC
  Administered 2024-05-24: 0.2 mg
  Filled 2024-05-24: qty 1

## 2024-05-24 MED ORDER — ACETAMINOPHEN 325 MG PO TABS: 650.0000 mg | ORAL_TABLET | ORAL | Status: DC | PRN

## 2024-05-24 MED ORDER — DIBUCAINE (PERIANAL) 1 % EX OINT: 1.0000 | TOPICAL_OINTMENT | CUTANEOUS | Status: DC | PRN

## 2024-05-24 MED ORDER — ZOLPIDEM TARTRATE 5 MG PO TABS: 5.0000 mg | ORAL_TABLET | Freq: Every evening | ORAL | Status: DC | PRN

## 2024-05-24 MED ORDER — IBUPROFEN 600 MG PO TABS
600.0000 mg | ORAL_TABLET | Freq: Four times a day (QID) | ORAL | Status: DC
  Administered 2024-05-24 – 2024-05-25 (×4): 600 mg via ORAL
  Filled 2024-05-24 (×6): qty 1

## 2024-05-24 MED ORDER — COCONUT OIL OIL
1.0000 | TOPICAL_OIL | Status: DC | PRN
  Administered 2024-05-25: 1 via TOPICAL

## 2024-05-24 MED ORDER — SENNOSIDES-DOCUSATE SODIUM 8.6-50 MG PO TABS
2.0000 | ORAL_TABLET | Freq: Every day | ORAL | Status: DC
  Administered 2024-05-24: 2 via ORAL
  Filled 2024-05-24 (×2): qty 2

## 2024-05-24 MED ORDER — WITCH HAZEL-GLYCERIN EX PADS
1.0000 | MEDICATED_PAD | CUTANEOUS | Status: DC | PRN
  Administered 2024-05-24: 1 via TOPICAL

## 2024-05-24 MED ORDER — SIMETHICONE 80 MG PO CHEW: 80.0000 mg | CHEWABLE_TABLET | ORAL | Status: DC | PRN

## 2024-05-24 MED ORDER — SODIUM CHLORIDE 0.9 % IV SOLN
500.0000 mg | Freq: Once | INTRAVENOUS | Status: AC
  Administered 2024-05-24: 500 mg via INTRAVENOUS
  Filled 2024-05-24: qty 25

## 2024-05-24 MED ORDER — DIPHENHYDRAMINE HCL 25 MG PO CAPS: 25.0000 mg | ORAL_CAPSULE | Freq: Four times a day (QID) | ORAL | Status: DC | PRN

## 2024-05-24 MED ORDER — SENNOSIDES-DOCUSATE SODIUM 8.6-50 MG PO TABS: 2.0000 | ORAL_TABLET | Freq: Every day | ORAL | Status: DC

## 2024-05-24 MED ORDER — ONDANSETRON HCL 4 MG/2ML IJ SOLN
4.0000 mg | INTRAMUSCULAR | Status: DC | PRN
  Administered 2024-05-24: 4 mg via INTRAVENOUS
  Filled 2024-05-24: qty 2

## 2024-05-24 MED ORDER — ONDANSETRON HCL 4 MG PO TABS: 4.0000 mg | ORAL_TABLET | ORAL | Status: DC | PRN

## 2024-05-24 MED ORDER — PRENATAL MULTIVITAMIN CH
1.0000 | ORAL_TABLET | Freq: Every day | ORAL | Status: DC
  Filled 2024-05-24: qty 1

## 2024-05-24 MED ORDER — BENZOCAINE-MENTHOL 20-0.5 % EX AERO: 1.0000 | INHALATION_SPRAY | CUTANEOUS | Status: DC | PRN

## 2024-05-24 MED ORDER — FERROUS SULFATE 325 (65 FE) MG PO TABS
325.0000 mg | ORAL_TABLET | Freq: Every day | ORAL | Status: DC
  Filled 2024-05-24: qty 1

## 2024-05-24 MED ORDER — METHYLERGONOVINE MALEATE 0.2 MG/ML IJ SOLN: 0.2000 mg | Freq: Once | INTRAMUSCULAR | Status: DC

## 2024-05-24 NOTE — Social Work (Signed)
 CSW received consult for hx of Anxiety.  CSW met with MOB to offer support and complete assessment. CSW entered the room and observed MOB resting in bed holding the infant and FOB at bedside. CSW introduced self, CSW role and reason for visit. MOB was agreeable to visit and allowed FOB to remain in the room. CSW inquired about how MOB was feeling, MOB reported good. 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 MOB reported she experienced complications after birth which resulted in her experiencing some Anxiety. MOB reported she never took any medication and was able to mange her symptoms on her own. CSW provided education regarding the baby blues period vs. perinatal mood disorders, discussed treatment and gave resources for mental health follow up if concerns arise.  CSW recommends self-evaluation during the postpartum time period using the New Mom Checklist from Postpartum Progress and encouraged MOB to contact a medical professional if symptoms are noted at any time.  MOB identified FOB and church family as her supports.   CSW provided review of Sudden Infant Death Syndrome (SIDS) precautions.  MOB reported she has all essential items fr the infant including a bassinet, crib and car seat.  CSW identifies no further need for intervention and no barriers to discharge at this time.  Clyda Smyth, LCSWA Clinical Social Worker [phone]

## 2024-05-24 NOTE — Progress Notes (Signed)
 Patient reports getting out of bed and ambulating to the bathroom a couple of times without feeling the need to call staff for assistance. Patient states she felt better getting up but still feels some heaviness in her chest; however, not as much. Palpated mass in lower right abdominal quadrant. Mass feels longer, but not as wide. Patient also states that she is feeling more GI/gas movement now than she was before. Provided heat pack to place on her abdomen and encouraged increased fluid intake and increased ambulation as long as she feels comfortable with ambulation. Patient in agreement with plan. Tammy, Steva Benedict

## 2024-05-24 NOTE — Progress Notes (Signed)
 Notified Deborah Berry, Deborah Berry, that patient is feeling heaviness in chest upon standing and ambulating to bathroom. Patient was able to use the stedy to the bathroom and ambulated back to bed but was anxious about the possibility of passing out due to her history of passing out with her first bathroom visit with her first baby. Patient did better with her most recent trip to the bathroom as she was not able to ambulate first thing this morning. Patient also mentioned a pain in her right lower abdominal quadrant. Noticeable mass palpated after patient emptied bladder. Patient states that she notices the discomfort more so when she breast feeds the baby. Encouraged drinking warm liquids and keeping bladder empty. Patient has not passed much gas and has not had a bowel movement. Deborah Chasteen to come see patient. Tammy, Steva [place]

## 2024-05-24 NOTE — Progress Notes (Signed)
 RN called to notify of pt BM and passing flatus, no longer feeling same loss of sensation/numbness in left leg and moving around room with more ease. Denies SOB and reports breathing easier. Routine monitoring at this time, consider EPDS tomorrow of pt anxiety returns.

## 2024-05-24 NOTE — Progress Notes (Signed)
" ° °  PPD #1 S/P NSVD  Live born female  Birth Weight: 7 lb 15.7 oz (3620 g) APGAR: 9, 9  Newborn Delivery   Birth date/time: 05/[birth year]/2024 23:57:00 Delivery type: Vaginal, Spontaneous    Baby name: Deborah Berry  Delivering provider: JOSHUA PALMA K  Lacerations: None  Circumcision: Declined.   Feeding: breast  Pain control at delivery: Epidural  S:  Reports feeling well today, mild numbness on maternal left d/t epidural, aid with movement at this time. Slight SOB overnight.              Tolerating PO/No nausea or vomiting             Bleeding is light             Pain controlled with ibuprofen  (OTC)             Up ad lib/ambulatory/voiding without difficulties   O:  A & O x 3, in no apparent distress              VS:  Vitals:   05/24/24 0130 05/24/24 0145 05/24/24 0237 05/24/24 0346  BP: (!) 102/56 106/60 108/62 105/62  Pulse: 66 71 73 67  Resp:   18 18  Temp:   98.3 F (36.8 C) 98.4 F (36.9 C)  TempSrc:   Oral Oral  SpO2:    99%  Weight:      Height:        LABS:  Recent Labs    05/23/24 0805 05/24/24 0418  WBC 8.8 18.2*  HGB 13.4 10.9*  HCT 39.5 31.6*  PLT 148* 133*    Blood type: --/--/A POS (01/18 0805)  Rubella:     I&O: I/O last 3 completed shifts: In: -  Out: 1006 [Urine:30; Blood:976]          No intake/output data recorded.  Gen: AAO x 3, NAD Abdomen: soft, non-tender, non-distended Fundus: firm, non-tender, U-1 Perineum: Intact.  Lochia: Minimal.  Extremities: No edema, no calf pain or tenderness   A/P:  PPD 1#  28 y.o., [id number]   Principal Problem:   Postpartum care following vaginal delivery 1/18 Active Problems:   Normal labor   SVD (spontaneous vaginal delivery)   Positive GBS test    Doing well - stable status PPD1.   GBS positive, abx refused. H/o PP endometritis, counseling on infection and 48 PP stay extensively given.  Anxiety, not on meds at this time. Follow EPDS PP. Acute anemia to blood loss, reviewed symptomatic with SOB and  heart palptiations over night, discussed IV Venofer  for PP support. Pt declines and will consider to rpt CBC tomorrow and then consider IV iron . To continue PO iron  for now.    Routine post partum orders.   Anticipate discharge at 48hrs /PPD2 as refusal of GBS prophylaxis in labor.   Sherrilee KANDICE Both, MSN, CNM 05/24/2024, 8:24 AM     "

## 2024-05-24 NOTE — Progress Notes (Signed)
 To bedside for pt complaint of SOB and light headedness walking. Pt reports hydrating well orally, ate breakfast this AM and nothing since. Numbness left leg still present per pt report.  - PE; Unremarkable, Unlabored breathing, lungs clear on all quadrants, s2 s1 present, HR 80 to auscultation/radial palpation. Bowel sounds present, fundus firm and -1. Left sided tenderness and firm mound palpated in L out quadrant, minimal distension from gas. Flatus, no BM.  No edema in all extremities, superficial numbness in L thigh.   - Stool softener order to encourage BM. Hydration and ambulation with aid encouraged.   - Pt very anxious and seems unable to relax, discussed findings with pt as h/o PP endometritis, no signs of infection at this time.  Orthostatics ordered. To monitor temp for signs of infection GBS pos, declined abx.   - Mild anemia, PO iron  through pregnancy, desires IV iron  at this time.   - POC routine PP care/vitals, to assess as indicated for status change.   Vitals: Temp:  [98.1 F (36.7 C)-98.4 F (36.9 C)] 98.3 F (36.8 C) (01/19 0805) Pulse Rate:  [66-108] 74 (01/19 0805) Resp:  [16-20] 16 (01/19 0805) BP: (91-127)/(48-69) 112/61 (01/19 0805) SpO2:  [95 %-100 %] 98 % (01/19 0805) Weight:  [64.4 kg] 64.4 kg (01/18 1553)

## 2024-05-24 NOTE — Lactation Note (Signed)
 This note was copied from a baby's chart. Lactation Consultation Note  Patient Name: Deborah Berry Unijb'd Date: 05/24/2024 Age:29 years Reason for consult: L&D Initial assessment;Mother's request;Term;Breastfeeding assistance  P2- LC consulted with MOB to assist with infant's first latch in L&D after confirming with RN that it was okay to see MOB. MOB had a doula and FOB present in the room. RN informed LC that MOB would like hands off teaching. LC congratulated MOB as infant was attempting to breast crawl. LC offered MOB the choice of LC talking MOB through how to latch vs LC's hands on assisting. MOB chose LC only talking MOB through the latch. LC reviewed the different latching positions we could try and asked which one she would like to use. MOB asked her doula which one she recommends and the doula said the cross cradle. MOB was unable to lift infant to this position. LC offered to assist hands on again. MOB accepted LC's assistance with positioning infant on the right breast in the cross cradle hold.   MOB initially tried the cradle hold. LC reviewed why the cross cradle hold is easier for infant and encouraged MOB to move her hands in the correct place. MOB switched to the cross cradle hold. Infant was screaming at this point and MOB was still unable to latch with LC's instructions. LC offered hands on assistance again with holding infant to the breast. MOB reported feeling weak and receptive to [facility] holding infant only. LC was able to latch infant to the right breast with first attempt without touching MOB's breast. Infant had a deep latch with flanged lips and a strong rhythmic pull. Infant nursed for 4 minutes, then MOB had multiple episodes of emesis and infant fell off of the breast. At first [facility] assisted MOB with holding infant as she was throwing up to ensure she did not drop the baby. Once she stopped throwing up, LC offered to relatch infant for MOB because he was screaming and rooting.  MOB sharply said I don't want to do anything right now. LC reassured MOB that this is okay and LC was only asking in case MOB needed assistance. LC congratulated MOB and left the room.  Maternal Data Has patient been taught Hand Expression?: No Does the patient have breastfeeding experience prior to this delivery?: Yes How long did the patient breastfeed?: 6 weeks  Feeding Mother's Current Feeding Choice: Breast Milk  LATCH Score Latch: Grasps breast easily, tongue down, lips flanged, rhythmical sucking.  Audible Swallowing: None  Type of Nipple: Everted at rest and after stimulation  Comfort (Breast/Nipple): Soft / non-tender  Hold (Positioning): Assistance needed to correctly position infant at breast and maintain latch.  LATCH Score: 7   Interventions Interventions: Breast feeding basics reviewed;Assisted with latch;Adjust position;Position options;Support pillows;Education;LC Services brochure  Discharge Discharge Education: Engorgement and breast care;Warning signs for feeding baby  Consult Status Consult Status: Follow-up from L&D Date: 05/24/24 Follow-up type: In-patient    Recardo Hoit BS, IBCLC 05/24/2024, 12:54 AM

## 2024-05-24 NOTE — Progress Notes (Signed)
 Patient notified this RN that she had a bowel movement and some gas and upon palpation of the abdomen, mass in right lower quadrant is not present. No mass felt in abdomen at this time. Tammy, Steva [place]

## 2024-05-24 NOTE — Anesthesia Postprocedure Evaluation (Signed)
"   Anesthesia Post Note  Patient: Deborah Berry  Procedure(s) Performed: AN AD HOC LABOR EPIDURAL     Patient location during evaluation: Mother Baby Anesthesia Type: Epidural Level of consciousness: awake, awake and alert and oriented Pain management: satisfactory to patient Vital Signs Assessment: post-procedure vital signs reviewed and stable Respiratory status: spontaneous breathing, nonlabored ventilation and respiratory function stable Cardiovascular status: blood pressure returned to baseline and stable Postop Assessment: no backache, no headache, patient able to bend at knees, no apparent nausea or vomiting, able to ambulate and adequate PO intake Anesthetic complications: no   No notable events documented.  Last Vitals:  Vitals:   05/24/24 0237 05/24/24 0346  BP: 108/62 105/62  Pulse: 73 67  Resp: 18 18  Temp: 36.8 C 36.9 C  SpO2:  99%    Last Pain:  Vitals:   05/24/24 0612  TempSrc:   PainSc: 0-No pain   Pain Goal:                   Deborah Berry      "

## 2024-05-24 NOTE — Lactation Note (Signed)
 This note was copied from a baby's chart. Lactation Consultation Note  Patient Name: Deborah Berry Unijb'd Date: 05/24/2024 Age:31 hours, P2  Reason for consult: Initial assessment;Term;Nipple pain/trauma As LC entered the room the baby latched on the right breast cradle.  Per mom pinching initially and better. LC recommended due to sore nipple bilaterally breast milk to the nipples , hand express, and pre - pump to prime the milk ducts to enhance the flow before the latch.  Use firm support with either the cross cradle or football until the soreness improves and the baby is latching consistently well.  LC provided breast shells for between feedings expect sleeping.  Steps for latching until the soreness improves.  Breast feeding goals - feed with cues or by 3 hours STS.  Nutritive vs non- nutritive sucking patterns and watch for hanging out latched.   Maternal Data Has patient been taught Hand Expression?:  (per mom feels comfortable with hand expressing) Does the patient have breastfeeding experience prior to this delivery?: Yes  Feeding Mother's Current Feeding Choice: Breast Milk  LATCH Score Latch:  (latched on the right breast with depth)  Audible Swallowing:  (swallows noted)  Type of Nipple:  (nipple well rounded when the baby released)  Comfort (Breast/Nipple):  (per mom comfortable)  Hold (Positioning):  (mom had latched the baby)      Lactation Tools Discussed/Used Tools: Pump;Flanges;Shells Flange Size: 18;21 Breast pump type: Manual Pump Education: Setup, frequency, and cleaning;Milk Storage Reason for Pumping: prior to latching due to soreness  Interventions Interventions: Breast feeding basics reviewed;Breast compression;Support pillows;Shells;Hand pump;Education;LC Services brochure;CDC milk storage guidelines;CDC Guidelines for Breast Pump Cleaning  Discharge Pump: Manual;Personal;DEBP (Spectra )  Consult Status Consult Status: Follow-up Date:  05/25/24 Follow-up type: In-patient    Rollene Caldron Lucynda Rosano 05/24/2024, 3:24 PM

## 2024-05-25 DIAGNOSIS — D62 Acute posthemorrhagic anemia: Secondary | ICD-10-CM | POA: Diagnosis not present

## 2024-05-25 LAB — CBC
HCT: 24.3 % — ABNORMAL LOW (ref 36.0–46.0)
Hemoglobin: 8.5 g/dL — ABNORMAL LOW (ref 12.0–15.0)
MCH: 35.3 pg — ABNORMAL HIGH (ref 26.0–34.0)
MCHC: 35 g/dL (ref 30.0–36.0)
MCV: 100.8 fL — ABNORMAL HIGH (ref 80.0–100.0)
Platelets: 135 K/uL — ABNORMAL LOW (ref 150–400)
RBC: 2.41 MIL/uL — ABNORMAL LOW (ref 3.87–5.11)
RDW: 12.3 % (ref 11.5–15.5)
WBC: 10.3 K/uL (ref 4.0–10.5)
nRBC: 0 % (ref 0.0–0.2)

## 2024-05-25 MED ORDER — POLYSACCHARIDE IRON COMPLEX 150 MG PO CAPS: 150.0000 mg | ORAL_CAPSULE | Freq: Every day | ORAL | 1 refills | Status: AC

## 2024-05-25 NOTE — Lactation Note (Signed)
 This note was copied from a baby's chart. Lactation Consultation Note  Patient Name: Deborah Berry Unijb'd Date: 05/25/2024 Age:31 hours Reason for consult: Follow-up assessment;Mother's request;Term;Nipple pain/trauma  P2- RN informed LC that MOB was requesting a consult with LC because of her sore nipples. MOB reports that it is too painful to latch infant to the breast. LC noted blisters and bruises bilaterally on the nipples. LC reviewed latching techniques with MOB and how to achieve a deep latch. LC encouraged using the coconut oil or the silverettes, but not together. LC encouraged trying a 20 mm nipple shield for protection until the nipples heal. If the 20 mm feels too tight, try the 24 mm shield. LC reviewed with how to place the shield properly. MOB was able to reciprocate. If MOB tries all of these things and it still hurts too much to latch, then MOB is to cease latching infant for 1-2 days to heal her breasts. MOB should hand express or pump in the meantime to continue breast stimulation and feed back EBM. LC encouraged MOB to have a close follow up with her outpatient [facility] Harlene Sick. LC encouraged MOB to call for further assistance as needed.  Maternal Data Has patient been taught Hand Expression?: No Does the patient have breastfeeding experience prior to this delivery?: Yes  Feeding Mother's Current Feeding Choice: Breast Milk Nipple Type: Dr. Jonna level 1 (pts own bottle)  Lactation Tools Discussed/Used Tools: Pump;Flanges;Coconut oil;Nipple Shields Nipple shield size: 20;24 (see note) Flange Size: 18 (see note) Breast pump type: Manual Pump Education: Setup, frequency, and cleaning;Milk Storage  Interventions Interventions: Breast feeding basics reviewed;Hand pump;Coconut oil;Education;LC Services brochure  Discharge Discharge Education: Engorgement and breast care;Warning signs for feeding baby Pump: DEBP;Manual;Personal  Consult Status Consult Status:  Follow-up Date: 05/25/24 Follow-up type: In-patient    Recardo Hoit BS, IBCLC 05/25/2024, 3:02 AM

## 2024-05-25 NOTE — Discharge Summary (Signed)
 "  OB Discharge Summary  Patient Name: Deborah Berry DOB: March 07, 1996 MRN: [id number]  Date of admission: 05/23/2024 Delivering provider: JOSHUA PALMA K  Admitting diagnosis: Normal labor [O80, Z37.9] Intrauterine pregnancy: [redacted]w[redacted]d     Secondary diagnosis: Patient Active Problem List   Diagnosis Date Noted   Anemia associated with acute blood loss 05/25/2024   SVD (spontaneous vaginal delivery) 05/24/2024   Positive GBS test 05/24/2024   Postpartum care following vaginal delivery 1/18 05/24/2024   Normal labor 05/23/2024    Date of discharge: 05/25/2024   Discharge diagnosis: Principal Problem:   Postpartum care following vaginal delivery 1/18 Active Problems:   Normal labor   SVD (spontaneous vaginal delivery)   Positive GBS test   Anemia associated with acute blood loss                                                            Augmentation: N/A Pain control: Epidural Laceration:None Complications: None  Hospital course:  Onset of Labor With Vaginal Delivery      29 y.o. yo [id number] at [redacted]w[redacted]d was admitted in Latent Labor on 05/23/2024. Labor course was complicated by vaginal bleeding and GBS positive, declined IP antibiotics.  Membrane Rupture Time/Date: 11:44 PM,05/23/2024  Delivery Method:Vaginal, Spontaneous Operative Delivery:N/A Episiotomy: None Lacerations:  None Patient had a postpartum course complicated by anemia. Hgb was 8.5 and she was symptomatic and received IV Venofer . She is asymptomatic of her anemia on the day of discharge. She is ambulating, tolerating a regular diet, passing flatus, and urinating well. Patient is discharged home in stable condition on 05/25/24.  Newborn Data: Birth date:05/[birth year]/2024 Birth time:11:57 PM Gender:Female Living status:Living Apgars:9 ,9  Weight:3620 g  Physical exam  Vitals:   05/24/24 0805 05/24/24 1530 05/24/24 2000 05/25/24 0515  BP: 112/61 100/61 106/61 (!) 103/55  Pulse: 74 83 67 61  Resp: 16 18 19 18   Temp: 98.3 F  (36.8 C) 98.4 F (36.9 C) 98.3 F (36.8 C)   TempSrc: Oral Oral Oral Oral  SpO2: 98% 99% 99% 99%  Weight:      Height:       General: alert and cooperative Lochia: appropriate Uterine Fundus: firm Perineum: intact DVT Evaluation: No evidence of DVT seen on physical exam.  Labs: Lab Results  Component Value Date   WBC 10.3 05/25/2024   HGB 8.5 (L) 05/25/2024   HCT 24.3 (L) 05/25/2024   MCV 100.8 (H) 05/25/2024   PLT 135 (L) 05/25/2024      05/24/2024    3:33 PM  Edinburgh Postnatal Depression Scale Screening Tool  I have been able to laugh and see the funny side of things. 0  I have looked forward with enjoyment to things. 0  I have blamed myself unnecessarily when things went wrong. 0  I have been anxious or worried for no good reason. 2  I have felt scared or panicky for no good reason. 1  Things have been getting on top of me. 1  I have been so unhappy that I have had difficulty sleeping. 0  I have felt sad or miserable. 0  I have been so unhappy that I have been crying. 0  The thought of harming myself has occurred to me. 0  Edinburgh Postnatal Depression Scale Total 4   Discharge instructions:  per After Visit Summary  After Visit Meds:  Allergies as of 05/25/2024       Reactions   Penicillins Rash        Medication List     STOP taking these medications    ibuprofen  800 MG tablet Commonly known as: ADVIL        TAKE these medications    iron  polysaccharides 150 MG capsule Commonly known as: Ferrex 150 Take 1 capsule (150 mg total) by mouth daily.       Activity: Advance as tolerated. Pelvic rest for 6 weeks.   Newborn Data: Live born female  Birth Weight: 7 lb 15.7 oz (3620 g) APGAR: 9, 9  Newborn Delivery   Birth date/time: 05/[birth year]/2024 23:57:00 Delivery type: Vaginal, Spontaneous    Named Pamla Baby Feeding: Breast Circumcision: Declined Disposition:home with mother  Delivery Report:  Review the Delivery Report for details.     Follow up:  Follow-up Information     Joshua Alan POUR, CNM. Schedule an appointment as soon as possible for a visit in 6 week(s).   Specialty: Certified Nurse Midwife Contact information: [address] [place] [address] [phone]                Alan POUR Joshua, CNM, MSN 05/25/2024, 11:52 AM    "

## 2024-05-25 NOTE — Lactation Note (Addendum)
 This note was copied from a baby's chart. Lactation Consultation Note  Patient Name: Deborah Berry Date: 05/25/2024 Age:31 hours Reason for consult: Follow-up assessment;RN request;Nipple pain/trauma  P2, Mother's nipples are sore, abrasions on tips. She is using coconut oil and siliverettes.  First child and tongue restriction and revision. Mother will have MD evaluate this child. Provided resource sheet.   Mother is latching with #20NS and post pumping. Recommend if she is using the nipple shield, mother should pump every other feeding and give volume to baby at the next feeding. Observed mother pumping and recommend mother try 18 mm flange with next session. Mother pumped 18 ml and will give 10 ml or more at the next feeding. Reviewed engorgement care and monitoring voids/stools. Mother has Lactation OP follow up in her home.  Maternal Data Does the patient have breastfeeding experience prior to this delivery?: Yes  Feeding Mother's Current Feeding Choice: Breast Milk Nipple Type: Dr. Jonna level 1  Lactation Tools Discussed/Used Tools: Pump;Flanges;Nipple Shields Nipple shield size: 20 Flange Size: 18;21 Breast pump type: Double-Electric Breast Pump;Manual Pump Education: Milk Storage;Setup, frequency, and cleaning Reason for Pumping: stimulation and supplementation Pumping frequency: every other feeding q 3hours for 15 min Pumped volume: 18 mL  Interventions Interventions: DEBP;Coconut oil  Discharge Discharge Education: Engorgement and breast care;Warning signs for feeding baby;Outpatient recommendation Pump: DEBP;Personal  Consult Status Consult Status: Complete Date: 05/25/24  Shannon Levorn Lemme  RN, IBCLC 05/25/2024, 9:32 AM

## 2024-06-03 ENCOUNTER — Telehealth (HOSPITAL_COMMUNITY): Payer: Self-pay | Admitting: *Deleted

## 2024-06-03 NOTE — Telephone Encounter (Signed)
 Attempted hospital discharge follow-up call. Left message for patient to return RN call with any questions or concerns. Allean IVAR Carton, RN, 06/03/24, [phone]
